# Patient Record
Sex: Male | Born: 1965 | Race: Black or African American | Hispanic: No | Marital: Married | Smoking: Never smoker
Health system: Southern US, Community
[De-identification: ages and names within clinical notes are randomized; demographics above are authoritative.]

## PROBLEM LIST (undated history)

## (undated) DIAGNOSIS — I10 Essential (primary) hypertension: Secondary | ICD-10-CM

## (undated) HISTORY — DX: Essential (primary) hypertension: I10

## (undated) HISTORY — PX: HERNIA REPAIR: SHX51

---
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
## 1986-11-27 ENCOUNTER — Emergency Department: Admit: 1986-11-27 | Payer: Self-pay | Source: Ambulatory Visit

## 1992-06-21 ENCOUNTER — Ambulatory Visit (INDEPENDENT_AMBULATORY_CARE_PROVIDER_SITE_OTHER): Admit: 1992-06-21 | Disposition: A | Discharge status: Home / Self Care | Payer: Self-pay | Source: Ambulatory Visit

## 1995-05-21 ENCOUNTER — Ambulatory Visit (INDEPENDENT_AMBULATORY_CARE_PROVIDER_SITE_OTHER): Admit: 1995-05-21 | Disposition: A | Discharge status: Home / Self Care | Payer: Self-pay | Source: Ambulatory Visit

## 1995-05-23 ENCOUNTER — Ambulatory Visit (INDEPENDENT_AMBULATORY_CARE_PROVIDER_SITE_OTHER): Admit: 1995-05-23 | Disposition: A | Discharge status: Home / Self Care | Payer: Self-pay | Source: Ambulatory Visit

## 1999-04-01 ENCOUNTER — Ambulatory Visit
Admission: RE | Admit: 1999-04-01 | Disposition: A | Discharge status: Home / Self Care | Payer: Self-pay | Source: Ambulatory Visit

## 2000-05-19 ENCOUNTER — Ambulatory Visit (INDEPENDENT_AMBULATORY_CARE_PROVIDER_SITE_OTHER): Admit: 2000-05-19 | Disposition: A | Discharge status: Home / Self Care | Payer: Self-pay | Source: Ambulatory Visit

## 2000-05-21 ENCOUNTER — Ambulatory Visit (INDEPENDENT_AMBULATORY_CARE_PROVIDER_SITE_OTHER): Admit: 2000-05-21 | Disposition: A | Discharge status: Home / Self Care | Payer: Self-pay | Source: Ambulatory Visit

## 2000-11-07 ENCOUNTER — Ambulatory Visit (INDEPENDENT_AMBULATORY_CARE_PROVIDER_SITE_OTHER): Admit: 2000-11-07 | Disposition: A | Discharge status: Home / Self Care | Payer: Self-pay | Source: Ambulatory Visit

## 2005-02-09 ENCOUNTER — Ambulatory Visit
Admission: RE | Admit: 2005-02-09 | Disposition: A | Discharge status: Home / Self Care | Payer: Self-pay | Source: Ambulatory Visit

## 2005-02-11 ENCOUNTER — Ambulatory Visit
Admission: RE | Admit: 2005-02-11 | Disposition: A | Discharge status: Home / Self Care | Payer: Self-pay | Source: Ambulatory Visit

## 2005-09-04 ENCOUNTER — Ambulatory Visit (INDEPENDENT_AMBULATORY_CARE_PROVIDER_SITE_OTHER): Admit: 2005-09-04 | Disposition: A | Discharge status: Home / Self Care | Payer: Self-pay | Source: Ambulatory Visit

## 2005-11-06 ENCOUNTER — Ambulatory Visit (INDEPENDENT_AMBULATORY_CARE_PROVIDER_SITE_OTHER): Admit: 2005-11-06 | Disposition: A | Discharge status: Home / Self Care | Payer: Self-pay | Source: Ambulatory Visit

## 2006-02-05 ENCOUNTER — Ambulatory Visit (INDEPENDENT_AMBULATORY_CARE_PROVIDER_SITE_OTHER): Admit: 2006-02-05 | Disposition: A | Discharge status: Home / Self Care | Payer: Self-pay | Source: Ambulatory Visit

## 2006-02-19 ENCOUNTER — Ambulatory Visit: Admit: 2006-02-19 | Disposition: A | Discharge status: Home / Self Care | Payer: Self-pay | Source: Ambulatory Visit

## 2006-02-23 ENCOUNTER — Ambulatory Visit
Admission: RE | Admit: 2006-02-23 | Disposition: A | Discharge status: Home / Self Care | Payer: Self-pay | Source: Ambulatory Visit

## 2006-03-08 ENCOUNTER — Ambulatory Visit
Admission: RE | Admit: 2006-03-08 | Disposition: A | Discharge status: Home / Self Care | Payer: Self-pay | Source: Ambulatory Visit

## 2007-02-20 ENCOUNTER — Ambulatory Visit (INDEPENDENT_AMBULATORY_CARE_PROVIDER_SITE_OTHER): Admit: 2007-02-20 | Disposition: A | Discharge status: Home / Self Care | Payer: Self-pay | Source: Ambulatory Visit

## 2007-07-12 ENCOUNTER — Ambulatory Visit (INDEPENDENT_AMBULATORY_CARE_PROVIDER_SITE_OTHER): Admit: 2007-07-12 | Disposition: A | Discharge status: Home / Self Care | Payer: Self-pay | Source: Ambulatory Visit

## 2007-12-14 ENCOUNTER — Ambulatory Visit: Admit: 2007-12-14 | Disposition: A | Discharge status: Home / Self Care | Payer: Self-pay | Source: Ambulatory Visit

## 2008-06-01 ENCOUNTER — Ambulatory Visit (INDEPENDENT_AMBULATORY_CARE_PROVIDER_SITE_OTHER): Admit: 2008-06-01 | Disposition: A | Discharge status: Home / Self Care | Payer: Self-pay | Source: Ambulatory Visit

## 2008-08-11 ENCOUNTER — Emergency Department
Admission: EM | Admit: 2008-08-11 | Disposition: A | Discharge status: Home / Self Care | Payer: Self-pay | Source: Ambulatory Visit

## 2008-08-13 ENCOUNTER — Emergency Department
Admission: EM | Admit: 2008-08-13 | Disposition: A | Discharge status: Home / Self Care | Payer: Self-pay | Source: Ambulatory Visit

## 2008-08-17 ENCOUNTER — Emergency Department
Admission: EM | Admit: 2008-08-17 | Disposition: A | Discharge status: Home / Self Care | Payer: Self-pay | Source: Ambulatory Visit

## 2008-09-17 ENCOUNTER — Ambulatory Visit (INDEPENDENT_AMBULATORY_CARE_PROVIDER_SITE_OTHER): Admit: 2008-09-17 | Disposition: A | Discharge status: Home / Self Care | Payer: Self-pay | Source: Ambulatory Visit

## 2008-12-22 ENCOUNTER — Ambulatory Visit (INDEPENDENT_AMBULATORY_CARE_PROVIDER_SITE_OTHER): Admit: 2008-12-22 | Disposition: A | Discharge status: Home / Self Care | Payer: Self-pay | Source: Ambulatory Visit

## 2009-03-06 ENCOUNTER — Ambulatory Visit (INDEPENDENT_AMBULATORY_CARE_PROVIDER_SITE_OTHER): Admit: 2009-03-06 | Disposition: A | Discharge status: Home / Self Care | Payer: Self-pay | Source: Ambulatory Visit

## 2009-04-10 ENCOUNTER — Ambulatory Visit (INDEPENDENT_AMBULATORY_CARE_PROVIDER_SITE_OTHER): Admit: 2009-04-10 | Disposition: A | Discharge status: Home / Self Care | Payer: Self-pay | Source: Ambulatory Visit

## 2009-04-15 ENCOUNTER — Ambulatory Visit (INDEPENDENT_AMBULATORY_CARE_PROVIDER_SITE_OTHER): Admit: 2009-04-15 | Disposition: A | Discharge status: Home / Self Care | Payer: Self-pay | Source: Ambulatory Visit

## 2009-08-04 ENCOUNTER — Ambulatory Visit (INDEPENDENT_AMBULATORY_CARE_PROVIDER_SITE_OTHER): Admit: 2009-08-04 | Disposition: A | Discharge status: Home / Self Care | Payer: Self-pay | Source: Ambulatory Visit

## 2009-08-21 ENCOUNTER — Ambulatory Visit (INDEPENDENT_AMBULATORY_CARE_PROVIDER_SITE_OTHER): Admit: 2009-08-21 | Disposition: A | Discharge status: Home / Self Care | Payer: Self-pay | Source: Ambulatory Visit

## 2010-01-10 ENCOUNTER — Ambulatory Visit
Admission: RE | Admit: 2010-01-10 | Disposition: A | Discharge status: Home / Self Care | Payer: Self-pay | Source: Ambulatory Visit

## 2010-03-09 ENCOUNTER — Ambulatory Visit (INDEPENDENT_AMBULATORY_CARE_PROVIDER_SITE_OTHER): Admit: 2010-03-09 | Disposition: A | Discharge status: Home / Self Care | Payer: Self-pay | Source: Ambulatory Visit

## 2010-03-21 ENCOUNTER — Ambulatory Visit (INDEPENDENT_AMBULATORY_CARE_PROVIDER_SITE_OTHER): Admit: 2010-03-21 | Disposition: A | Discharge status: Home / Self Care | Payer: Self-pay | Source: Ambulatory Visit

## 2010-04-10 ENCOUNTER — Ambulatory Visit (INDEPENDENT_AMBULATORY_CARE_PROVIDER_SITE_OTHER): Admit: 2010-04-10 | Disposition: A | Discharge status: Home / Self Care | Payer: Self-pay | Source: Ambulatory Visit

## 2010-05-25 ENCOUNTER — Ambulatory Visit (INDEPENDENT_AMBULATORY_CARE_PROVIDER_SITE_OTHER): Admit: 2010-05-25 | Disposition: A | Discharge status: Home / Self Care | Payer: Self-pay | Source: Ambulatory Visit

## 2010-08-12 ENCOUNTER — Ambulatory Visit (INDEPENDENT_AMBULATORY_CARE_PROVIDER_SITE_OTHER): Admit: 2010-08-12 | Disposition: A | Discharge status: Home / Self Care | Payer: Self-pay | Source: Ambulatory Visit

## 2010-12-23 ENCOUNTER — Ambulatory Visit (INDEPENDENT_AMBULATORY_CARE_PROVIDER_SITE_OTHER): Admit: 2010-12-23 | Disposition: A | Discharge status: Home / Self Care | Payer: Self-pay | Source: Ambulatory Visit

## 2011-08-15 ENCOUNTER — Ambulatory Visit (INDEPENDENT_AMBULATORY_CARE_PROVIDER_SITE_OTHER): Admit: 2011-08-15 | Disposition: A | Discharge status: Home / Self Care | Payer: Self-pay | Source: Ambulatory Visit

## 2012-06-24 ENCOUNTER — Ambulatory Visit (INDEPENDENT_AMBULATORY_CARE_PROVIDER_SITE_OTHER): Admit: 2012-06-24 | Disposition: A | Discharge status: Home / Self Care | Payer: Self-pay | Source: Ambulatory Visit

## 2012-08-12 ENCOUNTER — Ambulatory Visit (INDEPENDENT_AMBULATORY_CARE_PROVIDER_SITE_OTHER): Payer: Exclusive Provider Organization | Attending: Family Medicine

## 2012-08-12 ENCOUNTER — Encounter (INDEPENDENT_AMBULATORY_CARE_PROVIDER_SITE_OTHER): Payer: Self-pay

## 2012-08-12 ENCOUNTER — Ambulatory Visit (INDEPENDENT_AMBULATORY_CARE_PROVIDER_SITE_OTHER): Payer: Exclusive Provider Organization | Admitting: Family Medicine

## 2012-08-12 VITALS — BP 120/78 | HR 80 | Temp 98.2°F | Resp 17 | Ht 69.0 in | Wt 208.4 lb

## 2012-08-12 MED ORDER — NABUMETONE 750 MG PO TABS
750.00 mg | ORAL_TABLET | Freq: Every day | ORAL | Status: DC
Start: 2012-08-12 — End: 2015-11-20

## 2012-08-12 MED ORDER — TRAMADOL HCL 50 MG PO TABS
50.0000 mg | ORAL_TABLET | Freq: Four times a day (QID) | ORAL | Status: DC | PRN
Start: 2012-08-12 — End: 2014-01-06

## 2012-08-12 MED ORDER — HYDROCODONE-ACETAMINOPHEN 7.5-325 MG PO TABS
1.00 | ORAL_TABLET | Freq: Four times a day (QID) | ORAL | Status: DC | PRN
Start: 2012-08-12 — End: 2012-08-12

## 2012-08-12 MED ORDER — TIZANIDINE HCL 4 MG PO TABS
4.00 mg | ORAL_TABLET | Freq: Three times a day (TID) | ORAL | Status: DC | PRN
Start: 2012-08-12 — End: 2014-01-06

## 2012-08-12 NOTE — Progress Notes (Signed)
Subjective:       Patient ID: Bradley Hubbard is a 47 y.o. male.    Back Pain  This is a new problem. The current episode started 1 to 4 weeks ago. The problem occurs constantly. The problem has been gradually worsening since onset. The pain is present in the lumbar spine and gluteal. The quality of the pain is described as aching. The pain does not radiate. The pain is moderate. The pain is the same all the time. The symptoms are aggravated by bending, twisting, lying down and sitting. Stiffness is present in the morning. Pertinent negatives include no abdominal pain, bladder incontinence, bowel incontinence, chest pain, dysuria, fever, leg pain, numbness, paresis, paresthesias, pelvic pain, tingling or weakness. He has tried analgesics and NSAIDs for the symptoms. The treatment provided no relief.   off and on has back pain right lower back stiff and onset two weeks getting lots worse, can't get comfortable.  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         Review of Systems   Constitutional: Negative for fever.   Respiratory: Negative for cough and chest tightness.    Cardiovascular: Negative for chest pain.   Gastrointestinal: Negative.  Negative for abdominal pain and bowel incontinence.   Genitourinary: Negative for bladder incontinence, dysuria and pelvic pain.   Musculoskeletal: Positive for back pain.   Neurological: Negative for tingling, weakness, numbness and paresthesias.   Psychiatric/Behavioral: Negative.            Objective:    Physical Exam   Nursing note and vitals reviewed.  Constitutional: He is oriented to person, place, and time. He appears well-developed and well-nourished.   HENT:   Head: Normocephalic and atraumatic.   Eyes: Pupils are equal, round, and reactive to light.   Neck: Neck supple.   Cardiovascular: Normal rate, regular rhythm and normal heart sounds.  Exam reveals no gallop and no friction rub.    No murmur heard.  Pulmonary/Chest: Effort normal and  breath sounds normal. No respiratory distress. He has no wheezes. He has no rales. He exhibits no tenderness.   Abdominal: Soft. Bowel sounds are normal. He exhibits no distension and no mass. There is no tenderness. There is no rebound and no guarding.   Musculoskeletal:        Tenderness over lower lumbar spine especially right lower side.  SLR neg to 90 degrees, normal heel/toe walk. DTR's 2+ and =     Neurological: He is alert and oriented to person, place, and time.   Skin: Skin is warm.   Psychiatric: He has a normal mood and affect. His behavior is normal. Judgment and thought content normal.           Assessment:       Back strain      Plan:       Lab Results from today's visit:  No results found for this or any previous visit (from the past 4 hour(s)).    Radiology Results from today's visit:  No results found.    Assessment/Plan for today's visit:  1. Back pain  X-ray lumbar spine complete    tiZANidine (ZANAFLEX) 4 MG tablet    nabumetone (RELAFEN) 750 MG tablet    DISCONTINUED: HYDROcodone-acetaminophen (NORCO) 7.5-325 MG per tablet   2. Back strain, initial encounter  traMADol (ULTRAM) 50 MG tablet       Chaya Jan, MD  [facility]  08/12/2012 12:10  PM        Patient Instructions     Back Spasm [No Trauma]    Spasm of the back muscles can occur after a sudden forceful twisting or bending force (such as in a car accident), after a simple awkward movement, or after lifting something heavy with poor body positioning. In either case, muscle spasm is often present and adds to the pain. Sleeping in an awkward position or on a poor quality mattress can also cause this. Some persons respond to emotional stress by tensing the muscles of their back.  The treatment described below will usually help the pain to go away in 5-7 days. Pain that continues may require further evaluation or other types of treatment such as physical therapy.  Unless you had a physical injury (for example, a car accident  or fall), x-rays are usually not ordered for the initial evaluation of back pain. If pain continues and does not respond to medical treatment, x-rays and other tests may be performed at a later time.   Home Care:  1. You may need to stay in bed the first few days. But, as soon as possible, begin sitting or walking to avoid problems with prolonged bed rest (muscle weakness, worsening back stiffness and pain, blood clots in the legs).   2. When in bed, try to find a position of comfort. A firm mattress is best. Try lying flat on your back with pillows under your knees. You can also try lying on your side with your knees bent up toward your chest and a pillow between your knees.  3. Avoid prolonged sitting. This puts more stress on the lower back than standing or walking.   4. Some persons find relief with heat (hot shower, hot bath, or heating pad) and massage, while others prefer cold packs (crushed or cubed ice in a plastic bag, wrapped in a towel). Try both and use the method that feels best for 20 minutes several times a day.  5. You may use acetaminophen (Tylenol) or ibuprofen (Motrin, Advil) to control pain, unless another pain medicine was prescribed. [NOTE: If you have chronic liver or kidney disease or ever had a stomach ulcer or GI bleeding, talk with your doctor before using these medicines.]  6. Gentle stretching will help your back heal faster. Perform this simple routine 2-3 times a day until your back is feeling better.    LOW BACK STRETCH    Lie on your back with your knees bent and both feet on the ground.    Slowly raise your left knee to your chest as you flatten your lower back against the floor. Hold for 5 seconds.    Relax and repeat the exercise with your right knee.    Do 10 of these exercises for each leg.    Repeat, hugging both knees to your chest at the same time.   7. Be aware of safe lifting methods and do not lift anything over 15 pounds until all the pain is gone.  Follow Up  with  your doctor or this facility if your symptoms do not start to improve after one week. Physical therapy or further tests may be needed.  [NOTE: If x-rays were taken, they will be reviewed by a radiologist. You will be notified of any new findings that may affect your care.]  Return Promptly or contact your doctor if any of the following occurs:   Pain becomes worse or spreads to your legs   Weakness  or numbness in one or both legs   Loss of bowel or bladder control   Numbness in the groin or genital area   Unexplained fever over 100.74F (38.0C)   Burning or pain when passing urine   [address] [place], [place] [address]. All rights reserved. This information is not intended as a substitute for professional medical care. Always follow your healthcare professional's instructions.

## 2012-08-12 NOTE — Patient Instructions (Signed)
Back Spasm [No Trauma]    Spasm of the back muscles can occur after a sudden forceful twisting or bending force (such as in a car accident), after a simple awkward movement, or after lifting something heavy with poor body positioning. In either case, muscle spasm is often present and adds to the pain.Sleeping in an awkward position or on a poor quality mattress can also cause this. Some persons respond to emotional stress by tensing the muscles of their back.  The treatment described below will usually help the pain to go away in 5-7 days. Pain that continues may require further evaluation or other types of treatment such as physical therapy.  Unless you had a physical injury (for example, a car accident or fall), x-rays are usually not ordered for the initial evaluation of back pain. If pain continues and does not respond to medical treatment, x-rays and other tests may be performed at a later time.  Home Care:  1. You may need to stay in bed the first few days. But, as soon as possible, begin sitting or walking to avoid problems with prolonged bed rest (muscle weakness, worsening back stiffness and pain, blood clots in the legs).  2. When in bed, try to find a position of comfort. A firm mattress is best. Try lying flat on your back with pillows under your knees. You can also try lying on your side with your knees bent up toward your chest and a pillow between your knees.  3. Avoid prolonged sitting. This puts more stress on the lower back than standing or walking.  4. Some persons find relief with heat (hot shower, hot bath, or heating pad) and massage, while others prefer cold packs (crushed or cubed ice in a plastic bag, wrapped in a towel). Try both and use the method that feels best for 20 minutes several times a day.  5. You may use acetaminophen (Tylenol) or ibuprofen (Motrin, Advil) to control pain, unless another pain medicine was prescribed. [NOTE: If you have chronic liver or kidney disease or  ever had a stomach ulcer or GI bleeding, talk with your doctor before using these medicines.]  6. Gentle stretching will help your back heal faster. Perform this simple routine 2-3 times a day until your back is feeling better.   LOW BACK STRETCH   Lie on your back with your knees bent and both feet on the ground.   Slowly raise your left knee to your chest as you flatten your lower back against the floor. Hold for 5 seconds.   Relax and repeat the exercise with your right knee.   Do 10 of these exercises for each leg.   Repeat, hugging both knees to your chest at the same time.  7. Be aware of safe lifting methods and do not lift anything over 15 pounds until all the pain is gone.  Follow Up  with your doctor or this facility if your symptoms do not start to improve after one week. Physical therapy or further tests may be needed.  [NOTE: If x-rays were taken, they will be reviewed by a radiologist. You will be notified of any new findings that may affect your care.]  Return Promptly or contact your doctor if any of the following occurs:   Pain becomes worse or spreads to your legs   Weakness or numbness in one or both legs   Loss of bowel or bladder control   Numbness in the groin or genital area     Unexplained fever over 100.4F (38.0C)   Burning or pain when passing urine   2000-2014 [employer], [address], [place] [address]. All rights reserved. This information is not intended as a substitute for professional medical care. Always follow your healthcare professional's instructions.

## 2012-08-15 ENCOUNTER — Telehealth (INDEPENDENT_AMBULATORY_CARE_PROVIDER_SITE_OTHER): Payer: Self-pay

## 2012-08-15 NOTE — Telephone Encounter (Signed)
Courtesy call- Message left for patient.

## 2012-09-28 ENCOUNTER — Ambulatory Visit (INDEPENDENT_AMBULATORY_CARE_PROVIDER_SITE_OTHER): Payer: Exclusive Provider Organization | Admitting: Physician Assistant

## 2012-09-28 ENCOUNTER — Encounter (INDEPENDENT_AMBULATORY_CARE_PROVIDER_SITE_OTHER): Payer: Self-pay

## 2012-09-28 VITALS — BP 125/88 | HR 73 | Temp 98.7°F | Resp 18 | Wt 205.0 lb

## 2012-09-28 LAB — VH POCT UA-AUTOMATED(UCC)
Bilirubin, UA POCT: NEGATIVE
Blood, UA POCT: NEGATIVE
Glucose, UA POCT: NEGATIVE
Ketones, UA POCT: NEGATIVE mg/dL
Nitrite, UA POCT: NEGATIVE
PH, UA POCT: 6.5 (ref 4.6–8)
Protein, UA POCT: NEGATIVE mg/dL
Specific Gravity, UA POCT: 1.025 mg/dL (ref 1.001–1.035)
Urine Leukocytes POCT: NEGATIVE
Urobilinogen, UA POCT: 0.2 mg/dL

## 2012-09-28 MED ORDER — TAMSULOSIN HCL 0.4 MG PO CAPS
0.40 mg | ORAL_CAPSULE | ORAL | Status: AC
Start: 2012-09-28 — End: 2012-10-05

## 2012-09-28 NOTE — Progress Notes (Signed)
Subjective:    Patient ID: Bradley Hubbard is a 47 y.o. male.    HPI Comments: Patient said he denies any injury to cause his right mid back pain.  He said he did a normal work out routine while at work yesterday and didn't have any symptoms. Then around dinner time while cooking on the grill he had sudden sharp pain in his right mid back.  He denies any personal or family history of kidney stones.  He took some old pain medication and said he was up all night and could not get comfortable and cant sit still today.  No fevers or chills.  He denies drinking enough fluids while at work in the heat.  He took some old zanaflex last night but did not help his symptoms.  He denies any difficulty or pain with urination.    Flank Pain  This is a new problem. The current episode started yesterday. The problem occurs constantly. The pain is present in the thoracic spine. The quality of the pain is described as aching and stabbing. The pain does not radiate. The pain is at a severity of 7/10. Pertinent negatives include no abdominal pain or dysuria.       The following portions of the patient's history were reviewed and updated as appropriate: allergies, current medications, past family history, past medical history, past social history, past surgical history and problem list.    Review of Systems   Constitutional: Negative.    HENT: Negative.    Respiratory: Negative.    Cardiovascular: Negative.    Gastrointestinal: Negative.  Negative for abdominal pain.   Genitourinary: Positive for flank pain. Negative for dysuria.   Musculoskeletal: Positive for back pain.         Objective:    BP 125/88  Pulse 73  Temp 98.7 F (37.1 C) (Oral)  Resp 18  Wt 92.987 kg (205 lb)    Physical Exam   Nursing note and vitals reviewed.  Constitutional: He is oriented to person, place, and time. He appears well-developed and well-nourished.   HENT:   Mouth/Throat: Oropharynx is clear and moist.   Neck: Neck supple.   Cardiovascular:  Normal rate, regular rhythm and normal heart sounds.    Pulmonary/Chest: Effort normal and breath sounds normal.   Abdominal: Soft. Bowel sounds are normal. There is no CVA tenderness.   Musculoskeletal: He exhibits tenderness. He exhibits no edema.        Thoracic back: He exhibits tenderness and pain. He exhibits normal range of motion, no bony tenderness, no swelling, no edema, no deformity, no laceration, no spasm and normal pulse.        Back:    Neurological: He is alert and oriented to person, place, and time.   Psychiatric: He has a normal mood and affect. His behavior is normal.         Assessment and Plan:       Lab Results from today's visit:  Recent Results (from the past 4 hour(s))   VH POCT UA-AUTOMATED(UCC)    Collection Time    09/28/12  5:47 PM       Component Value Range    Color UA POCT Dark Yellow      Clarity UA POCT Clear      Glucose, UA POCT Negative  Negative    Bilirubin, UA POCT Negative  Negative    Ketones, UA POCT Negative  Negative mg/dL    Specific Gravity, UA POCT 1.025  1.001 -  1.035 mg/dL    Blood, UA POCT  Negative  Negative    PH, UA POCT 6.5  4.6 - 8    Protein, UA POCT Negative  Negative mg/dL    Urobilinogen, UA POCT 0.2  0.2, 1.0 mg/dL    Nitrite, UA POCT Negative  Negative    Leukocytes, UA POCT Negative  Negative       Radiology Results from today's visit:  No results found.    Assessment/Plan for today's visit:  1. Back pain  VH POCT UA AUTO    tamsulosin (FLOMAX) 0.4 MG Cap       Nathen May Morven, [place]  [facility]  09/28/2012 6:20 PM        Patient Instructions       Understanding Kidney Stones  Your kidneys are the chemical filters for your body. These bean-shaped organs constantly screen your blood, removing wastes and excess fluids. Healthy kidneys maintain the chemical balance your body needs.    What Are Kidney Stones?  Kidney stones are made up of chemical crystals that separate out from urine. These crystals clump together to make"stones."They form in the  calyx of the kidney. They may stay in the kidney or move into the urinary tract.  Why Kidney Stones Form  Kidneys form stones for many reasons. If you don't drink enough fluid, for instance, you won't have enough urine to dilute chemicals. Then the chemicals may form crystals, which can develop into stones.   Fluid loss (dehydration) can concentrate urine, causing stones to form.   Certain foods contain large amounts of the chemicals that sometimes crystallize into stones.   Kidney infections foster stones by slowing urine flow or changing the acid balance of your urine.   Family history: If relatives have had kidney stones, you're more likely to have them, too.  Where Stones Form  Stones begin in the cup-shaped part of the kidney (calyx). Some stay in the calyx and grow. Others moveinto the kidney pelvis or into the ureter. There they can lodge, block the flow of urine, and cause pain.  Symptoms  Many stones cause sudden and severe pain andbloody urine. Others cause nausea or frequent, burning urination. Symptoms often depend on your stone's size and location. Fever may indicate a serious infection. Call your doctor right away if you develop a fever.   [address] [place], [place] [address]. All rights reserved. This information is not intended as a substitute for professional medical care. Always follow your healthcare professional's instructions.      Back Pain [Acute Or Chronic]    Back pain is usually caused by an injury to the muscles or ligaments of the spine. Sometimes the disks that separate each bone in the spine may bulge and cause pain by pressing on a nearby nerve. Back pain may also appear after a sudden twisting/bending force (such as in a car accident), after a simple awkward movement, or lifting something heavy with poor body positioning. In either case, muscle spasm is often present and adds to the pain.  Acute back pain usually gets better in one to two weeks.  Back pain related to disk disease, arthritis in the spinal joints or spinal stenosis (narrowing of the spinal canal) can become chronic and last for months or years.  Unless you had a physical injury (for example, a car accident or fall) X-rays are usually not ordered for the initial evaluation of back pain. If pain continues and  does not respond to medical treatment, x-rays and other tests may be performed at a later time.  Home Care:  1. You may need to stay in bed the first few days. But, as soon as possible, begin sitting or walking to avoid problems with prolonged bed rest (muscle weakness, worsening back stiffness and pain, blood clots in the legs).  2. When in bed, try to find a position of comfort. A firm mattress is best. Try lying flat on your back with pillows under your knees. You can also try lying on your side with your knees bent up towards your chest and a pillow between your knees.  3. Avoid prolonged sitting. This puts more stress on the lower back than standing or walking.  4. During the first two days after injury, apply an ICE PACK to the painful area for 20 minutes every 2-4 hours. This will reduce swelling and pain. HEAT (hot shower, hot bath or heating pad) works well for muscle spasm. You can start with ice, then switch to heat after two days. Some patients feel best alternating ice and heat treatments. Use the one method that feels the best to you.  5. You may use acetaminophen (Tylenol) or ibuprofen (Motrin, Advil) to control pain, unless another pain medicine was prescribed. [NOTE: If you have chronic liver or kidney disease or ever had a stomach ulcer or GI bleeding, talk with your doctor before using these medicines.]  6. Be aware of safe lifting methods and do not lift anything over 15 pounds until all the pain is gone.  Follow Up  with your doctor or this facility if your symptoms do not start to improve after one week. Physical therapy may be needed.  [NOTE: If X-rays were taken,  they will be reviewed by a radiologist. You will be notified of any new findings that may affect your care.]  Get Prompt Medical Attention  if any of the following occur:   Pain becomes worse or spreads to your legs   Weakness or numbness in one or both legs   Loss of bowel or bladder control   Numbness in the groin or genital area   [address] [place], [place] [address]. All rights reserved. This information is not intended as a substitute for professional medical care. Always follow your healthcare professional's instructions.        Patient aware to call PCP in the morning for further evaluation of right mid back pain. He can try flomax for possible kidney stone causing pain.  Away flomax has similar chemical compounds that make up sulfa drug but not a sulfa drug.  Since broke out in rash from sulfa small change for compound reaction but to be aware of risk and rash could be worse second time around.  Recommended tylenol/ibuprofen for pain.  Gave list for urology in [place], worsening symptoms during the night or tomorrow proceed to ER.  Patient agrees with plan.  Encouraged increased hydration with water.            Leanord Asal, [place]  [facility]  09/28/2012  5:33 PM

## 2012-09-28 NOTE — Patient Instructions (Addendum)
Understanding Kidney Stones  Your kidneys are the chemical filters for your body. These bean-shaped organs constantly screen your blood, removing wastes and excess fluids. Healthy kidneys maintain the chemical balance your body needs.    What Are Kidney Stones?  Kidney stones are made up of chemical crystals that separate out from urine. These crystals clump together to make"stones."They form in the calyx of the kidney. They may stay in the kidney or move into the urinary tract.  Why Kidney Stones Form  Kidneys form stones for many reasons. If you don't drink enough fluid, for instance, you won't have enough urine to dilute chemicals. Then the chemicals may form crystals, which can develop into stones.   Fluid loss (dehydration) can concentrate urine, causing stones to form.   Certain foods contain large amounts of the chemicals that sometimes crystallize into stones.   Kidney infections foster stones by slowing urine flow or changing the acid balance of your urine.   Family history: If relatives have had kidney stones, you're more likely to have them, too.  Where Stones Form  Stones begin in the cup-shaped part of the kidney (calyx). Some stay in the calyx and grow. Others moveinto the kidney pelvis or into the ureter. There they can lodge, block the flow of urine, and cause pain.  Symptoms  Many stones cause sudden and severe pain andbloody urine. Others cause nausea or frequent, burning urination. Symptoms often depend on your stone's size and location. Fever may indicate a serious infection. Call your doctor right away if you develop a fever.   [address] [place], [place] [address]. All rights reserved. This information is not intended as a substitute for professional medical care. Always follow your healthcare professional's instructions.      Back Pain [Acute Or Chronic]    Back pain is usually caused by an injury to the muscles or ligaments of the spine. Sometimes  the disks that separate each bone in the spine may bulge and cause pain by pressing on a nearby nerve. Back pain may also appear after a sudden twisting/bending force (such as in a car accident), after a simple awkward movement, or lifting something heavy with poor body positioning. In either case, muscle spasm is often present and adds to the pain.  Acute back pain usually gets better in one to two weeks. Back pain related to disk disease, arthritis in the spinal joints or spinal stenosis (narrowing of the spinal canal) can become chronic and last for months or years.  Unless you had a physical injury (for example, a car accident or fall) X-rays are usually not ordered for the initial evaluation of back pain. If pain continues and does not respond to medical treatment, x-rays and other tests may be performed at a later time.  Home Care:  1. You may need to stay in bed the first few days. But, as soon as possible, begin sitting or walking to avoid problems with prolonged bed rest (muscle weakness, worsening back stiffness and pain, blood clots in the legs).  2. When in bed, try to find a position of comfort. A firm mattress is best. Try lying flat on your back with pillows under your knees. You can also try lying on your side with your knees bent up towards your chest and a pillow between your knees.  3. Avoid prolonged sitting. This puts more stress on the lower back than standing or walking.  4. During the first two days after  injury, apply an ICE PACK to the painful area for 20 minutes every 2-4 hours. This will reduce swelling and pain. HEAT (hot shower, hot bath or heating pad) works well for muscle spasm. You can start with ice, then switch to heat after two days. Some patients feel best alternating ice and heat treatments. Use the one method that feels the best to you.  5. You may use acetaminophen (Tylenol) or ibuprofen (Motrin, Advil) to control pain, unless another pain medicine was prescribed. [NOTE: If  you have chronic liver or kidney disease or ever had a stomach ulcer or GI bleeding, talk with your doctor before using these medicines.]  6. Be aware of safe lifting methods and do not lift anything over 15 pounds until all the pain is gone.  Follow Up  with your doctor or this facility if your symptoms do not start to improve after one week. Physical therapy may be needed.  [NOTE: If X-rays were taken, they will be reviewed by a radiologist. You will be notified of any new findings that may affect your care.]  Get Prompt Medical Attention  if any of the following occur:   Pain becomes worse or spreads to your legs   Weakness or numbness in one or both legs   Loss of bowel or bladder control   Numbness in the groin or genital area   [address], [place], [place] [address]. All rights reserved. This information is not intended as a substitute for professional medical care. Always follow your healthcare professional's instructions.

## 2012-09-30 ENCOUNTER — Ambulatory Visit (INDEPENDENT_AMBULATORY_CARE_PROVIDER_SITE_OTHER)
Admission: RE | Admit: 2012-09-30 | Discharge: 2012-09-30 | Disposition: A | Discharge status: Home / Self Care | Payer: Exclusive Provider Organization | Source: Ambulatory Visit | Attending: Physician Assistant | Admitting: Physician Assistant

## 2012-09-30 ENCOUNTER — Other Ambulatory Visit (INDEPENDENT_AMBULATORY_CARE_PROVIDER_SITE_OTHER): Payer: Self-pay | Admitting: Urology

## 2012-09-30 DIAGNOSIS — N23 Unspecified renal colic: Secondary | ICD-10-CM

## 2013-01-17 ENCOUNTER — Encounter (INDEPENDENT_AMBULATORY_CARE_PROVIDER_SITE_OTHER): Payer: Self-pay

## 2013-01-17 ENCOUNTER — Ambulatory Visit (INDEPENDENT_AMBULATORY_CARE_PROVIDER_SITE_OTHER): Payer: Commercial Managed Care - POS | Admitting: Physician Assistant

## 2013-01-17 VITALS — BP 135/85 | HR 80 | Temp 98.1°F | Resp 19

## 2013-01-17 MED ORDER — AMOXICILLIN-POT CLAVULANATE 875-125 MG PO TABS
1.0000 | ORAL_TABLET | Freq: Two times a day (BID) | ORAL | Status: AC
Start: 2013-01-17 — End: 2013-01-27

## 2013-01-17 MED ORDER — FLUTICASONE PROPIONATE 50 MCG/ACT NA SUSP
NASAL | Status: AC
Start: 2013-01-17 — End: 2013-02-17

## 2013-01-17 NOTE — Patient Instructions (Addendum)
Viral Respiratory Illness [Adult]  You have an Upper Respiratory Illness (URI) caused by a virus. This illness is contagious during the first few days. It is spread through the air by coughing and sneezing or by direct contact (touching the sick person and then touching your own eyes, nose or mouth). Most viral illnesses go away within 7-10 days with rest and simple home remedies. Sometimes, the illness may last for several weeks. Antibiotics will not kill a virus and are generally not prescribed for this condition.    Home Care:  1) If symptoms are severe, rest at home for the first 2-3 days. When you resume activity, don't let yourself get too tired.  2) Avoid being exposed to cigarette smoke (yours or others').  3) Tylenol (acetaminophen) or ibuprofen (Advil, Motrin) will help fever, muscle aching and headache. (Persons under 18 with fever should not take aspirin since this may cause liver damage.)  4) Your appetite may be poor, so a light diet is fine. Avoid dehydration by drinking 6-8 glasses of fluids per day (water, soft, drinks, juices, tea, soup, etc.). Extra fluids will help loosen secretions in the nose and lungs.  5) Over-the-counter cold medicines will not shorten the length of time you're sick, but they may be helpful for the following symptoms: cough (Robitussin DM); sore throat (Chloraseptic lozenges or spray); nasal and sinus congestion (Actifed, Sudafed, Chlortrimeton).  Follow Up  with your doctor or as advised if you don't improve over the next week.  Get Prompt Medical Attention  if any of the following occur:  -- Cough with lots of colored sputum (mucus) or blood in your sputum  -- Chest pain, shortness of breath, wheezing or have trouble breathing  -- Severe headache; face, neck or ear pain  -- Fever over 100.4 F (38.0 C) for more than three days  -- You can't swallow due to throat pain   2000-2014 [employer], [address], [place] [address]. All rights reserved. This  information is not intended as a substitute for professional medical care. Always follow your healthcare professional's instructions.      Pharyngitis: Strep [Presumed]    Your illness has the signs of a strep throat infection. Strep throat is a contagious illness. It is spread by coughing, kissing or by touching others after touching your mouth or nose. Symptoms include throat pain worse with swallowing, aching all over, headache and fever. You will be treated with an antibiotic, which should make you start to feel better within 1-2 days.  Home Care:  1. Rest at home and drink plenty of fluids to avoid dehydration.  2. No school or work for the first two days on antibiotics. You will not be contagious after this time, and if you are feeling better, you can return to school or work.  3. Take your antibiotics for a full 10 days, even if you feel better after the first few days of treatment. This is very important to prevent complications from the strep infection (such as heart or kidney disease).  4. Children: Use acetaminophen (Tylenol) for fever, fussiness or discomfort. In infants over six months of age, you may use ibuprofen (Children's Motrin) instead of Tylenol. [NOTE: If your child has chronic liver or kidney disease or ever had a stomach ulcer or GI bleeding, talk with your doctor before using these medicines.] (Aspirin should never be used in anyone under 18 years of age who is ill with a fever. It may cause severe liver damage.)    Adults: You may use acetaminophen (Tylenol) or ibuprofen (Motrin, Advil) to control pain or fever, unless another medicine was prescribed for this. [NOTE: If you have chronic liver or kidney disease or ever had a stomach ulcer or GI bleeding, talk with your doctor before using these medicines.]  5. Throat lozenges or sprays (Chloraseptic and others) will reduce pain. Gargling with warm salt water will also reduce throat pain. Dissolve 1/2 teaspoon of salt in 1 glass of warm water.  This is especially useful just before meals.  Follow Up  with your doctor or as directed by our staff if you are not improving over the next week.  Get Prompt Medical Attention  if any of the following occur:   Fever over 100.5F (38.0C) oral, or over 101.5F (38.6C) rectal for more than three days   New or worsening ear pain, sinus pain or headache   Painful lumps in the back of your neck   Unable to swallow liquids or open your mouth wide due to throat pain   Trouble breathing or noisy breathing   Muffled voice   New rash   2000-2014 [employer], [address], [place] [address]. All rights reserved. This information is not intended as a substitute for professional medical care. Always follow your healthcare professional's instructions.

## 2013-01-17 NOTE — Progress Notes (Signed)
Subjective:    Patient ID: Bradley Hubbard is a 47 y.o. male.    HPI Comments: Patient tells me he has been coughing the past week with green mucous production worse in the morning and night, cloudy sputum during the day.  Daughter is currently being treated for strep throat.  No fevers or chills, no flu like symptoms.  He does admit to sinus pressure and sore throat.  No vomiting or diarrhea.    Cough  This is a new problem. The current episode started in the past 7 days. The problem has been gradually worsening. The cough is productive of sputum. Associated symptoms include nasal congestion, postnasal drip and a sore throat. Pertinent negatives include no ear congestion, ear pain, fever, headaches, hemoptysis, myalgias, shortness of breath or wheezing.       The following portions of the patient's history were reviewed and updated as appropriate: allergies, current medications, past family history, past medical history, past social history, past surgical history and problem list.    Review of Systems   Constitutional: Negative.  Negative for fever.   HENT: Positive for congestion, postnasal drip, sinus pressure and sore throat. Negative for ear pain.    Respiratory: Positive for cough. Negative for apnea, hemoptysis, choking, chest tightness, shortness of breath, wheezing and stridor.    Cardiovascular: Negative.    Gastrointestinal: Negative.    Musculoskeletal: Negative.  Negative for myalgias.   Neurological: Negative for headaches.         Objective:    BP 135/85  Pulse 80  Temp 98.1 F (36.7 C) (Oral)  Resp 19  SpO2 98%    Physical Exam   Nursing note and vitals reviewed.  Constitutional: He is oriented to person, place, and time. He appears well-developed and well-nourished.   HENT:   Right Ear: Tympanic membrane, external ear and ear canal normal.   Left Ear: Tympanic membrane, external ear and ear canal normal.   Nose: Mucosal edema present.   Mouth/Throat: Uvula is midline and mucous membranes  are normal. Posterior oropharyngeal erythema present. No oropharyngeal exudate, posterior oropharyngeal edema or tonsillar abscesses.   Neck: Neck supple.   Cardiovascular: Normal rate, regular rhythm and normal heart sounds.    Pulmonary/Chest: Effort normal and breath sounds normal.   Lymphadenopathy:     He has no cervical adenopathy.   Neurological: He is alert and oriented to person, place, and time.   Psychiatric: He has a normal mood and affect. His behavior is normal.         Assessment and Plan:       Lab Results from today's visit:  No results found for this or any previous visit (from the past 4 hour(s)).    Radiology Results from today's visit:  No results found.    Assessment/Plan for today's visit:  1. Pharyngitis  amoxicillin-clavulanate (AUGMENTIN) 875-125 MG per tablet   2. Nasal congestion  fluticasone (FLONASE) 50 MCG/ACT nasal spray       Leanord Asal, [place]  [facility]  01/17/2013 8:36 PM        Patient Instructions       Viral Respiratory Illness [Adult]  You have an Upper Respiratory Illness (URI) caused by a virus. This illness is contagious during the first few days. It is spread through the air by coughing and sneezing or by direct contact (touching the sick person and then touching your own eyes, nose or mouth). Most viral illnesses go away within 7-10  days with rest and simple home remedies. Sometimes, the illness may last for several weeks. Antibiotics will not kill a virus and are generally not prescribed for this condition.    Home Care:  1) If symptoms are severe, rest at home for the first 2-3 days. When you resume activity, don't let yourself get too tired.  2) Avoid being exposed to cigarette smoke (yours or others').  3) Tylenol (acetaminophen) or ibuprofen (Advil, Motrin) will help fever, muscle aching and headache. (Persons under 18 with fever should not take aspirin since this may cause liver damage.)  4) Your appetite may be poor, so a light diet is fine. Avoid  dehydration by drinking 6-8 glasses of fluids per day (water, soft, drinks, juices, tea, soup, etc.). Extra fluids will help loosen secretions in the nose and lungs.  5) Over-the-counter cold medicines will not shorten the length of time you're sick, but they may be helpful for the following symptoms: cough (Robitussin DM); sore throat (Chloraseptic lozenges or spray); nasal and sinus congestion (Actifed, Sudafed, Chlortrimeton).  Follow Up  with your doctor or as advised if you don't improve over the next week.  Get Prompt Medical Attention  if any of the following occur:  -- Cough with lots of colored sputum (mucus) or blood in your sputum  -- Chest pain, shortness of breath, wheezing or have trouble breathing  -- Severe headache; face, neck or ear pain  -- Fever over 100.4 F (38.0 C) for more than three days  -- You can't swallow due to throat pain   [address], [address] [place], [place] [address]. All rights reserved. This information is not intended as a substitute for professional medical care. Always follow your healthcare professional's instructions.      Pharyngitis: Strep [Presumed]    Your illness has the signs of a strep throat infection. Strep throat is a contagious illness. It is spread by coughing, kissing or by touching others after touching your mouth or nose. Symptoms include throat pain worse with swallowing, aching all over, headache and fever. You will be treated with an antibiotic, which should make you start to feel better within 1-2 days.  Home Care:  1. Rest at home and drink plenty of fluids to avoid dehydration.  2. No school or work for the first two days on antibiotics. You will not be contagious after this time, and if you are feeling better, you can return to school or work.  3. Take your antibiotics for a full 10 days, even if you feel better after the first few days of treatment. This is very important to prevent complications from the strep infection (such as  heart or kidney disease).  4. Children: Use acetaminophen (Tylenol) for fever, fussiness or discomfort. In infants over six months of age, you may use ibuprofen (Children's Motrin) instead of Tylenol. [NOTE: If your child has chronic liver or kidney disease or ever had a stomach ulcer or GI bleeding, talk with your doctor before using these medicines.] (Aspirin should never be used in anyone under 31 years of age who is ill with a fever. It may cause severe liver damage.)  Adults: You may use acetaminophen (Tylenol) or ibuprofen (Motrin, Advil) to control pain or fever, unless another medicine was prescribed for this. [NOTE: If you have chronic liver or kidney disease or ever had a stomach ulcer or GI bleeding, talk with your doctor before using these medicines.]  5. Throat lozenges or sprays (Chloraseptic and others)  will reduce pain. Gargling with warm salt water will also reduce throat pain. Dissolve 1/2 teaspoon of salt in 1 glass of warm water. This is especially useful just before meals.  Follow Up  with your doctor or as directed by our staff if you are not improving over the next week.  Get Prompt Medical Attention  if any of the following occur:   Fever over 100.60F (38.0C) oral, or over 101.60F (38.6C) rectal for more than three days   New or worsening ear pain, sinus pain or headache   Painful lumps in the back of your neck   Unable to swallow liquids or open your mouth wide due to throat pain   Trouble breathing or noisy breathing   Muffled voice   New rash   [address] [place], [place] [address]. All rights reserved. This information is not intended as a substitute for professional medical care. Always follow your healthcare professional's instructions.          Flonase nasal spray twice daily, will treat clinic strep throat given exposure to daughter and symptoms.  History of chronic sinusitis.  Encouraged probiotic use in between doses to help prevent diarrhea  related side effects.  Worrisome symptoms of concern please proceed to ER.              Leanord Asal, [place]  [facility]  01/17/2013  8:36 PM

## 2013-11-29 ENCOUNTER — Encounter (INDEPENDENT_AMBULATORY_CARE_PROVIDER_SITE_OTHER): Payer: Self-pay

## 2013-11-29 ENCOUNTER — Ambulatory Visit (INDEPENDENT_AMBULATORY_CARE_PROVIDER_SITE_OTHER): Payer: Commercial Managed Care - POS | Admitting: Family Medicine

## 2013-11-29 VITALS — BP 134/86 | HR 94 | Temp 101.3°F | Resp 16 | Ht 69.0 in | Wt 211.6 lb

## 2013-11-29 DIAGNOSIS — J029 Acute pharyngitis, unspecified: Secondary | ICD-10-CM

## 2013-11-29 DIAGNOSIS — J020 Streptococcal pharyngitis: Secondary | ICD-10-CM

## 2013-11-29 LAB — POCT RAPID STREP A: Rapid Strep A Screen POCT: POSITIVE — AB

## 2013-11-29 MED ORDER — AMOXICILLIN 875 MG PO TABS
875.0000 mg | ORAL_TABLET | Freq: Two times a day (BID) | ORAL | Status: AC
Start: 2013-11-29 — End: 2013-12-09

## 2013-11-29 NOTE — Progress Notes (Signed)
Subjective:    Patient ID: Bradley Hubbard is a 48 y.o. male.    Sore Throat   This is a new problem. The current episode started in the past 7 days. The problem has been gradually worsening. The maximum temperature recorded prior to his arrival was 101 - 101.9 F. He has had exposure to strep. He has tried nothing for the symptoms. The treatment provided no relief.       The following portions of the patient's history were reviewed and updated as appropriate: allergies, current medications, past family history, past medical history, past social history, past surgical history and problem list.    Review of Systems   Constitutional: Positive for fever and chills.   HENT: Positive for sinus pressure and sore throat.    All other systems reviewed and are negative.        Objective:    BP 134/86 mmHg  Pulse 94  Temp(Src) 101.3 F (38.5 C) (Oral)  Resp 16  Ht 1.753 m (5\' 9" )  Wt 95.981 kg (211 lb 9.6 oz)  BMI 31.23 kg/m2    Physical Exam   Constitutional: He is oriented to person, place, and time. He appears well-developed and well-nourished. No distress.   HENT:   Head: Normocephalic and atraumatic.   Right Ear: Tympanic membrane, external ear and ear canal normal.   Left Ear: Tympanic membrane, external ear and ear canal normal.   Nose: Nose normal.   Mouth/Throat: Oropharynx is clear and moist. No oropharyngeal exudate (red).   Eyes: Conjunctivae are normal.   Neck: Normal range of motion. Neck supple.   Cardiovascular: Normal rate, regular rhythm and normal heart sounds.    Pulmonary/Chest: Effort normal and breath sounds normal. No stridor. No respiratory distress. He has no wheezes. He has no rales.   Musculoskeletal: Normal range of motion.   Neurological: He is alert and oriented to person, place, and time.   Skin: Skin is warm and dry. He is not diaphoretic.   Psychiatric: He has a normal mood and affect. His behavior is normal. Judgment and thought content normal.   Nursing note and vitals  reviewed.          Lab Results last 48 Hours     Procedure Component Value Units Date/Time    POCT RAPID STREP A [[id number]]  (Abnormal) Collected:  11/29/13 1523    Specimen Information:  Throat Updated:  11/29/13 1523     POCT QC Pass      Rapid Strep A Screen POCT Positive (A)      Comment        Result:        Negative Results should be confirmed by throat Cx to confirm absence of Strep A inf.            Assessment and Plan:       1. Streptococcal sore throat    - amoxicillin (AMOXIL) 875 MG tablet; Take 1 tablet (875 mg total) by mouth 2 (two) times daily.  Dispense: 20 tablet; Refill: 0    2. Pharyngitis    - POCT RAPID STREP A  - amoxicillin (AMOXIL) 875 MG tablet; Take 1 tablet (875 mg total) by mouth 2 (two) times daily.  Dispense: 20 tablet; Refill: 0      Follow up with your Primary Care Physician or Return to Clinic if symptoms persist or worsen. Patient/Family verbalizes understanding.  Discussed with patient in length and detail signs and symptoms to seek immediate follow  up. Patient expressed understanding.   An After Visit Summary was printed and given to the patient.

## 2013-12-14 ENCOUNTER — Other Ambulatory Visit (INDEPENDENT_AMBULATORY_CARE_PROVIDER_SITE_OTHER): Payer: Self-pay | Admitting: Internal Medicine

## 2013-12-14 DIAGNOSIS — R911 Solitary pulmonary nodule: Secondary | ICD-10-CM

## 2013-12-25 ENCOUNTER — Ambulatory Visit (INDEPENDENT_AMBULATORY_CARE_PROVIDER_SITE_OTHER)
Admission: RE | Admit: 2013-12-25 | Discharge: 2013-12-25 | Disposition: A | Discharge status: Home / Self Care | Payer: Commercial Managed Care - POS | Source: Ambulatory Visit | Attending: Internal Medicine | Admitting: Internal Medicine

## 2013-12-25 ENCOUNTER — Encounter (INDEPENDENT_AMBULATORY_CARE_PROVIDER_SITE_OTHER): Payer: Self-pay

## 2013-12-25 DIAGNOSIS — R911 Solitary pulmonary nodule: Secondary | ICD-10-CM

## 2014-01-06 ENCOUNTER — Ambulatory Visit (INDEPENDENT_AMBULATORY_CARE_PROVIDER_SITE_OTHER): Payer: Commercial Managed Care - POS | Admitting: Family Medicine

## 2014-01-06 ENCOUNTER — Encounter (INDEPENDENT_AMBULATORY_CARE_PROVIDER_SITE_OTHER): Payer: Self-pay

## 2014-01-06 VITALS — BP 141/79 | HR 90 | Temp 98.3°F | Resp 16 | Ht 69.0 in | Wt 211.0 lb

## 2014-01-06 DIAGNOSIS — M62830 Muscle spasm of back: Secondary | ICD-10-CM

## 2014-01-06 MED ORDER — BACLOFEN 10 MG PO TABS
10.00 mg | ORAL_TABLET | Freq: Three times a day (TID) | ORAL | Status: DC
Start: 2014-01-06 — End: 2015-11-20

## 2014-01-06 NOTE — Patient Instructions (Signed)
NSAIDs/ice/heat/ROM/massage

## 2014-01-06 NOTE — Progress Notes (Signed)
Subjective:    Patient ID: Bradley Hubbard is a 48 y.o. male.    Back Pain  This is a new problem. The current episode started today. The problem is unchanged. The pain is present in the lumbar spine. The quality of the pain is described as aching and cramping. The pain does not radiate. The pain is at a severity of 8/10. The symptoms are aggravated by bending and coughing.       The following portions of the patient's history were reviewed and updated as appropriate: allergies, current medications, past family history, past medical history, past social history, past surgical history and problem list.    Review of Systems   Musculoskeletal: Positive for back pain.         Objective:    BP 141/79 mmHg  Pulse 90  Temp(Src) 98.3 F (36.8 C) (Oral)  Resp 16  Ht 1.753 m (5\' 9" )  Wt 95.709 kg (211 lb)  BMI 31.15 kg/m2    Physical Exam   Constitutional: He appears well-developed and well-nourished.   Cardiovascular: Normal rate.    Pulmonary/Chest: Breath sounds normal.   Musculoskeletal: He exhibits tenderness. He exhibits no edema.   Pain, spasm, stiffness lumbar low back R>L. Neg SLRs. No B&B c/o         Assessment and Plan:       Bradley Hubbard was seen today for back pain.    Diagnoses and all orders for this visit:    Spasm of back muscles  Orders:  -     baclofen (LIORESAL) 10 MG tablet; Take 1 tablet (10 mg total) by mouth 3 (three) times daily.            Orlie Pollen, MD  [facility]  01/06/2014  4:36 PM

## 2014-05-02 ENCOUNTER — Ambulatory Visit (INDEPENDENT_AMBULATORY_CARE_PROVIDER_SITE_OTHER): Payer: Commercial Managed Care - POS | Admitting: Nurse Practitioner

## 2014-05-02 ENCOUNTER — Ambulatory Visit (INDEPENDENT_AMBULATORY_CARE_PROVIDER_SITE_OTHER): Payer: Commercial Managed Care - POS | Attending: Nurse Practitioner

## 2014-05-02 ENCOUNTER — Encounter (INDEPENDENT_AMBULATORY_CARE_PROVIDER_SITE_OTHER): Payer: Self-pay

## 2014-05-02 VITALS — BP 143/82 | HR 70 | Temp 98.8°F | Resp 16 | Ht 69.0 in | Wt 200.0 lb

## 2014-05-02 DIAGNOSIS — Y998 Other external cause status: Secondary | ICD-10-CM

## 2014-05-02 DIAGNOSIS — Y92099 Unspecified place in other non-institutional residence as the place of occurrence of the external cause: Secondary | ICD-10-CM

## 2014-05-02 DIAGNOSIS — W230XXA Caught, crushed, jammed, or pinched between moving objects, initial encounter: Secondary | ICD-10-CM

## 2014-05-02 DIAGNOSIS — Y939 Activity, unspecified: Secondary | ICD-10-CM

## 2014-05-02 DIAGNOSIS — S6992XA Unspecified injury of left wrist, hand and finger(s), initial encounter: Secondary | ICD-10-CM

## 2014-05-02 NOTE — Progress Notes (Signed)
Fitted Lt. Middle finger with Stax splint and secured with Coban.

## 2014-05-02 NOTE — Progress Notes (Signed)
Subjective:    Patient ID: Bradley Hubbard is a 49 y.o. male.    HPI Bradley Hubbard presents to [facility] today due to left middle finger crush injury. He reports his entire finger hurts and radiates into his left hand. He rates his current pain level as 8/10.     The following portions of the patient's history were reviewed and updated as appropriate: allergies, current medications, past family history, past medical history, past social history, past surgical history and problem list.    Review of Systems   Review of Systems   Constitutional: Negative for fever, chills, appetite change and fatigue.   HENT: Negative   Eyes: Negative for discharge, altered vision  Respiratory: Negative for chest tightness and shortness of breath  Cardiovascular: Negative for chest pain   Gastrointestinal: Negative for nausea, vomiting, abdominal pain and diarrhea    Genitourinary: Negative for frequency, burning, difficulty urinating  Musculoskeletal: Positive for left middle finger crush injury  Skin: Negative for rash or open wounds    Neurological: Negative for headaches or dizziness  Psychiatric/Behavioral: Negative for behavioral changes        Objective:    BP 143/82 mmHg  Pulse 70  Temp(Src) 98.8 F (37.1 C) (Oral)  Resp 16  Ht 1.753 m (5\' 9" )  Wt 90.719 kg (200 lb)  BMI 29.52 kg/m2    Physical Exam   Constitutional: oriented to person, place, and time. Appears well-developed and well-nourished.   HENT:   Head: Normocephalic and atraumatic.   Right Ear: External ear normal.  Left Ear: External ear normal.   Nose: Nose normal without drainage.  Eyes: Normal conjunctiva without drainage  Pulmonary/Chest: Effort normal.   Musculoskeletal: Normal range of motion with pain distal left middle finger. There is tenderness to palpation of the entire left middle finger.    Neurological: The patient is alert and oriented to person, place, and time.   Skin: Skin is warm and dry. There is slight bleeding along the side of the nail of  the left middle finger.  Psychiatric:  Normal mood and affect.         Assessment and Plan:     X-ray Finger Left Pa, Oblique And Lateral    05/02/2014   3 views of the left third phalanx demonstrate no acute fracture nor destructive osseous lesion.  ReadingStation:[id number]  Bradley Hubbard was seen today for finger injury.    Diagnoses and all orders for this visit:    Injury of left middle finger, initial encounter  Orders:  -     X-ray Finger Left PA, Oblique and Lateral  -     Referral to Orthopaedic Sports Med  -     Application finger splint static; Standing  -     Application finger splint static    The patient was instructed to return to [facility] if his nailbed turns purple and a hole may be placed. He was instructed to follow up with ortho with continued pain. He verbalized understanding and agreed to the plan.      Sharene Butters, NP  [facility]  05/02/2014  2:33 PM

## 2014-05-02 NOTE — Patient Instructions (Signed)
Crush Injury of the Hand, No Fracture  You have a crush injury of your hand. This causes local pain, swelling, and sometimes bruising. You don't have any broken bones. This injury may take from a few days to a few weeks to heal.  If a fingernail has been severely injured, it may fall off in 1 to 2 weeks. A new one will usually start to grow back within a month.  Home care  Follow these guidelines when caring for yourself at home:   Keep your hand elevated to reduce pain and swelling. When sitting or lying down keep your arm raised above the level of your heart. You can do this by placing your arm on a pillow that rests on your chest or on a pillow at your side. This is most important during the first 2 days (48 hours) after the injury.   Put an ice pack on the injured area. Do this for 20 minutes every 1 to 2 hours the first day for pain relief. You can make an ice pack by wrapping a plastic bag of ice cubes in a thin towel. Continue to use the ice pack 3 to 4 times a day until the pain and swelling go away.   You may use acetaminophen or ibuprofen to control pain, unless another pain medicine was prescribed. If you have chronic liver or kidney disease, talk with your health care provider before using these medicines. Also talk with your provider if you've had a stomach ulcer or GI bleeding.   If you have a splint or cast, keep it dry at all times. Bathe with your splint or cast well out of the water. Protect it with a large plastic bag, rubber-banded at the top end. If a fiberglass cast or splint gets wet, you can dry it with a hair dryer.   Don't put creams or objects under the cast if you have itching.   Don't stick a needle into the wound to drain it.   Bruised skin may change colors over time. It may change from reddish to bluish to yellowish before the skin goes back to normal coloring.  Follow-up care  Follow up with your health care provider, or as advised, if you don't start to get better within  the next 3 days.  If X-rays were taken, a radiologist will look at them. You will be told of any new findings that may affect your care.  When to seek medical advice  Call your health care provider right away if any of these occur:   The cast cracks   The plaster cast or splint becomes wet or soft   The fiberglass cast or splint stays wet for more than 24 hours   Tightness or pain under the cast or splint gets worse   Fingers become swollen, cold, blue, numb, or tingly   Redness, warmth, swelling, drainage from the wound, or foul odor from a cast or splint   You can't move your fingers   Fever of 101F (38.3C) or higher, or as directed by your health care provider   2000-2015 [employer] [address], [place] [address]. All rights reserved. This information is not intended as a substitute for professional medical care. Always follow your healthcare professional's instructions.

## 2014-05-18 ENCOUNTER — Ambulatory Visit (INDEPENDENT_AMBULATORY_CARE_PROVIDER_SITE_OTHER): Payer: Commercial Managed Care - POS | Admitting: Physician Assistant

## 2014-05-18 ENCOUNTER — Encounter (INDEPENDENT_AMBULATORY_CARE_PROVIDER_SITE_OTHER): Payer: Self-pay

## 2014-05-18 VITALS — BP 139/72 | HR 68 | Temp 98.8°F | Resp 15 | Ht 69.0 in | Wt 200.0 lb

## 2014-05-18 DIAGNOSIS — J0100 Acute maxillary sinusitis, unspecified: Secondary | ICD-10-CM

## 2014-05-18 MED ORDER — FLUTICASONE PROPIONATE 50 MCG/ACT NA SUSP
NASAL | Status: AC
Start: 2014-05-18 — End: 2014-06-18

## 2014-05-18 MED ORDER — AMOXICILLIN-POT CLAVULANATE 875-125 MG PO TABS
1.0000 | ORAL_TABLET | Freq: Two times a day (BID) | ORAL | Status: DC
Start: 2014-05-18 — End: 2015-11-20

## 2014-05-18 NOTE — Progress Notes (Signed)
Subjective:    Patient ID: Bradley Hubbard is a 49 y.o. male.    Sinus Problem  This is a new problem. The current episode started in the past 7 days. The problem has been gradually worsening since onset. The maximum temperature recorded prior to his arrival was 100.4 - 100.9 F. The fever has been present for less than 1 day. Associated symptoms include congestion, coughing, ear pain and sinus pressure. Past treatments include nothing.       The following portions of the patient's history were reviewed and updated as appropriate: allergies, current medications, past family history, past medical history, past social history, past surgical history and problem list.    Review of Systems   Constitutional: Negative for fever.   HENT: Positive for congestion, ear pain and sinus pressure.    Respiratory: Positive for cough.    Gastrointestinal: Negative for vomiting and diarrhea.   Skin: Negative for rash and wound.   Hematological: Negative for adenopathy.         Objective:    BP 139/72 mmHg  Pulse 68  Temp(Src) 98.8 F (37.1 C) (Oral)  Resp 15  Ht 1.753 m (5\' 9" )  Wt 90.719 kg (200 lb)  BMI 29.52 kg/m2    Physical Exam   Constitutional: He appears well-developed and well-nourished. No distress.   HENT:   Head: Normocephalic.   Right Ear: External ear normal.   Left Ear: External ear normal.   Mouth/Throat: Oropharynx is clear and moist.   Eyes: Pupils are equal, round, and reactive to light.   Neck: Normal range of motion. Neck supple.   Cardiovascular: Normal rate, regular rhythm and normal heart sounds.    No murmur heard.  Pulmonary/Chest: Effort normal and breath sounds normal. No stridor. No respiratory distress. He has no wheezes. He has no rales.   Musculoskeletal: Normal range of motion.   Neurological: He is alert.   Skin: Skin is warm and dry. No rash noted. He is not diaphoretic. No erythema. No pallor.   Psychiatric: He has a normal mood and affect.   Nursing note and vitals reviewed.         Assessment and Plan:       Bradley Hubbard was seen today for sinus problem.    Diagnoses and all orders for this visit:    Acute maxillary sinusitis, recurrence not specified  Orders:  -     amoxicillin-clavulanate (AUGMENTIN) 875-125 MG per tablet; Take 1 tablet by mouth 2 (two) times daily. Take with food.  Take with probiotics.  -     fluticasone (FLONASE) 50 MCG/ACT nasal spray; Use 2 Sprays into each nostril daily            Joyce Gross, [place]  [facility]  05/18/2014  11:18 AM

## 2014-05-18 NOTE — Patient Instructions (Signed)
Sinusitis (Antibiotic Treatment)    The sinuses are air-filled spaces within the bones of the face. They connect to the inside of the nose.Sinusitisis an inflammation of the tissue lining the sinus cavity. Sinus inflammation can occur during a cold. It can also be due to allergies to pollens and other particles in the air. Sinusitis can cause symptoms of sinus congestion and fullness. A sinus infection causes fever, headache and facial pain. There is often green or yellow drainage from the nose or into the back of the throat (post-nasal drip). You have been given antibiotics to treat this condition.  Home care:   Take the full course of antibiotics as instructed. Do not stop taking them, even if you feel better.   Drink plenty of water, hot tea, and other liquids. This may help thin mucus. It also may promote sinus drainage.   Heat may help soothe painful areas of the face. Use a towel soaked in hot water. Or, stand in the shower and direct the hot spray onto your face. Using a vaporizer along with a menthol rub at night may also help.   Anexpectorantcontaining guaifenesin may help thin the mucus and promote drainage from the sinuses.   Over-the-counterdecongestantsmay be used unless a similar medicine was prescribed. Nasal sprays work the fastest. Use one that contains phenylephrine or oxymetazoline. First blow the nose gently. Then use the spray. Do not use these medicines more often than directed on the label or symptoms may get worse. You may also use tablets containing pseudoephedrine. Avoid products that combine ingredients, because side effects may be increased. Read labels. You can also ask the pharmacist for help. (NOTE:Persons with high blood pressure should not use decongestants. They can raise blood pressure.)   Over-the-counterantihistaminesmay help if allergies contributed to your sinusitis.    Do not use nasal rinses or irrigation during an acute sinus infection, unless told to by  your health care provider. Rinsing may spread the infection to other sinuses.   Use acetaminophen or ibuprofen to control pain, unless another pain medicine was prescribed. (If you have chronic liver or kidney disease or ever had a stomach ulcer, talk with your doctor before using these medicines. Aspirin should never be used in anyone under 18 years of age who is ill with a fever. It may cause severe liver damage.)   Don't smoke. This can worsen symptoms.  Follow-up care  Follow up with your healthcare provider or our staff if you are not improving within the next week.  When to seek medical advice  Call your healthcare provider if any of these occur:   Facial pain or headache becoming more severe   Stiff neck   Unusual drowsiness or confusion   Swelling of the forehead or eyelids   Vision problems, including blurred or double vision   Fever of100.4F (38C)or higher, or as directed by your healthcare provider   Seizure   Breathing problems   Symptoms not resolving within 10 days   2000-2015 [employer] [address], [place] [address]. All rights reserved. This information is not intended as a substitute for professional medical care. Always follow your healthcare professional's instructions.

## 2014-05-21 ENCOUNTER — Telehealth (INDEPENDENT_AMBULATORY_CARE_PROVIDER_SITE_OTHER): Payer: Self-pay

## 2014-05-21 NOTE — Telephone Encounter (Signed)
Message left for patient to callback if he has any questions or concerns re: recent visit.

## 2015-07-29 ENCOUNTER — Inpatient Hospital Stay: Admission: RE | Admit: 2015-07-29 | Payer: Commercial Managed Care - POS | Source: Ambulatory Visit

## 2015-07-29 DIAGNOSIS — G4733 Obstructive sleep apnea (adult) (pediatric): Secondary | ICD-10-CM

## 2015-07-31 NOTE — Procedures (Signed)
Date: 07/29/2015  CHIEF COMPLAINT: "I need my CPAP compliance reviewed."    HISTORY OF PRESENT ILLNESS: Mr. Scearce is a very pleasant  gentleman.  He is followed by Dr. Darrall Dears.  Because of the  complaints of daytime fatigue and snoring, the patient was  referred to the sleep lab.  The patient's diagnostic study  demonstrated an RDI of 31.  The patient was brought back in and  had a CPAP titration performed.  On 10 cm of CPAP, the patient  had near total resolution of events.    He presents today in followup.  The patient wears a CPAP every  night.  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  They have a very  unusual work schedule 24 hours on, 24 hours off for four days  and then they are off for four days.  Given this, the patient  sleep compliance on the nights he is working is approximately  70%.  On nights where he is not working, it is 100%.  I have  seen the documentation for this.  In addition to all of this,  the patient indicates that he feels significantly improved.  Prior to the patient's sleep study, he was only admitting that  he was minimally fatigued.  He woke up every day with a mild  headache, which he also dismissed.  Subsequent to wearing CPAP,  the patient has found a significant amount of energy that he  thought he just did not have.    He also notes that the headache has resolved.    No snoring with CPAP in place.  The patient denies nocturia.    OTHER MEDICAL PROBLEMS: Include hypertension.    No family members with sleep apnea.    Denies sleep walking or sleep talking.  Denies dreams or  nightmares.    GENERAL REVIEW OF SYSTEMS: At this time, denies fevers or  chills.  Denies sweats.  Denies photophobia or diplopia.  Denies  neck stiffness or swelling.    Denies chest pain.    PHYSICAL EXAMINATION:  GENERAL:  Delightful gentleman, in no acute distress.  VITAL SIGNS:  Reviewed.  HEENT:  Normocephalic, atraumatic head.  Face symmetric.  Pupils  are equal and round.  Eyelids and conjunctivae are  unremarkable.  Oropharynx, redundant tissue, soft palate, and uvula.  Mallampati score is 3.  Neck circumference is normal.  Trachea  midline and mobile.  LUNGS:  Clear.  CARDIAC:  Regular rhythm.  EXTREMITIES:  No edema or cyanosis.  MENTAL STATUS:  Oriented x3 with normal memory and mood.  MUSCULOSKELETAL:  Gait and station are normal.    ASSESSMENT: The patient with documented obstructive sleep apnea.  The patient is wearing his CPAP appropriately and he is  benefitting from it.  The patient is therefore compliant with  his CPAP use by standard criteria.    At this time, the importance of weight loss and weight control  were discussed.    We will have the patient follow up with me on an as-needed basis  going forward.        [id number]  DD: 07/29/2015 15:07:30  DT: 07/29/2015 15:38:57  JOB: [id number]

## 2015-11-20 ENCOUNTER — Ambulatory Visit (INDEPENDENT_AMBULATORY_CARE_PROVIDER_SITE_OTHER): Payer: Commercial Managed Care - POS | Admitting: Family Medicine

## 2015-11-20 ENCOUNTER — Encounter (INDEPENDENT_AMBULATORY_CARE_PROVIDER_SITE_OTHER): Payer: Self-pay

## 2015-11-20 VITALS — BP 111/64 | HR 66 | Temp 99.0°F | Resp 16 | Ht 69.0 in | Wt 200.0 lb

## 2015-11-20 DIAGNOSIS — J0140 Acute pansinusitis, unspecified: Secondary | ICD-10-CM

## 2015-11-20 DIAGNOSIS — J029 Acute pharyngitis, unspecified: Secondary | ICD-10-CM

## 2015-11-20 LAB — POCT RAPID STREP A: Rapid Strep A Screen POCT: NEGATIVE

## 2015-11-20 MED ORDER — AMOXICILLIN-POT CLAVULANATE 875-125 MG PO TABS
1.0000 | ORAL_TABLET | Freq: Two times a day (BID) | ORAL | 0 refills | Status: AC
Start: 2015-11-20 — End: 2015-11-27

## 2015-11-20 NOTE — Patient Instructions (Addendum)
Sinusitis (No Antibiotics)    The sinuses are air-filled spaces within the bones of the face. They connect to the inside of the nose.Sinusitisis an inflammation of the tissue lining the sinus cavity. Sinus inflammation can occur during a cold.It can also be due to allergies to pollens and other particles in the air.It can cause symptoms such as sinus congestion,headache, sore throat, facial swellingand fullness. It may also cause a low-grade fever.No infection is present, and no antibiotic treatment is needed.  Home care   Drink plenty of water, hot tea, and other liquids. This may help thin mucus. It also may promote sinus drainage.   Heat may help soothe painful areas of the face. Use a towel soaked in hot water. Or, stand in the shower and direct the hot spray onto your face. Using a vaporizer along with a menthol rub at night may also help.   Anexpectorantcontaining guaifenesin may help thin the mucus and promote drainage from the sinuses.   Over-the-counterdecongestantsmay be used unless a similar medicine was prescribed. Nasal sprays work the fastest. Use one that contains phenylephrine or oxymetazoline. First blow the nose gently. Then use the spray. Do not use these medicines more often than directed on the label or symptoms may get worse. You may also use tablets containing pseudoephedrine. Avoid products that combine ingredients, because side effects may be increased. Read labels. You can also ask the pharmacist for help. (NOTE:Persons with high blood pressure should not use decongestants. They can raise blood pressure.)   Over-the-counterantihistaminesmay help if allergies contributed to your sinusitis.    Use acetaminophen or ibuprofen to control pain, unless another pain medicine was prescribed. (If you have chronic liver or kidney disease or ever had a stomach ulcer, talk with your doctor before using these medicines. Aspirin should never be used in anyone under 18 years of age  who is ill with a fever. It may cause severe liver damage.)   Use nasal rinses or irrigation as instructed by your health care provider.   Don't smoke. This can worsen symptoms.  Follow-up care  Follow up with your healthcare provider or our staff if you are not improving within the next week.  When to seek medical advice  Call your healthcare provider if any of these occur:   Green or yellow discharge from the nose or into the throat   Facial pain or headache becoming more severe   Stiff neck   Unusual drowsiness or confusion   Swelling of the forehead or eyelids   Vision problems, including blurred or double vision   Fever of100.4F (38C)or higher, or as directed by your healthcare provider   Seizure   Breathing problems   Symptoms not resolving within 10 days  Date Last Reviewed: 05/08/2013   2000-2016 [employer] [address], [place] [address]. All rights reserved. This information is not intended as a substitute for professional medical care. Always follow your healthcare professional's instructions.

## 2015-11-20 NOTE — Progress Notes (Signed)
Subjective:    Patient ID: Bradley Hubbard is a 50 y.o. male.    Sore Throat    This is a new problem. The current episode started in the past 7 days. The problem has been unchanged. Neither side of throat is experiencing more pain than the other. There has been no fever. The pain is moderate. Associated symptoms include congestion, coughing, ear pain and headaches. Pertinent negatives include no shortness of breath, trouble swallowing or vomiting. He has tried nothing for the symptoms.       The following portions of the patient's history were reviewed and updated as appropriate: allergies, current medications, past medical history, past social history, past surgical history and problem list.    Review of Systems   HENT: Positive for congestion and ear pain. Negative for trouble swallowing.    Respiratory: Positive for cough. Negative for shortness of breath.    Gastrointestinal: Negative for vomiting.   Neurological: Positive for headaches.   All other systems reviewed and are negative.        Objective:    BP 111/64   Pulse 66   Temp 99 F (37.2 C) (Oral)   Resp 16   Ht 1.753 m (5\' 9" )   Wt 90.7 kg (200 lb)   BMI 29.53 kg/m     Physical Exam   Constitutional: He is oriented to person, place, and time. He appears well-developed and well-nourished. No distress.   HENT:   Head: Normocephalic and atraumatic.   Right Ear: Tympanic membrane, external ear and ear canal normal.   Left Ear: Tympanic membrane, external ear and ear canal normal.   Nose: Mucosal edema present.   Mouth/Throat: Uvula is midline and mucous membranes are normal. Posterior oropharyngeal erythema present.   Eyes: Conjunctivae and EOM are normal.   Neck: Normal range of motion. Neck supple.   Cardiovascular: Normal rate and regular rhythm.    Pulmonary/Chest: Effort normal and breath sounds normal.   Musculoskeletal: Normal range of motion.   Lymphadenopathy:     He has no cervical adenopathy.   Neurological: He is alert and oriented  to person, place, and time.   Skin: Skin is warm and dry. He is not diaphoretic.   Psychiatric: He has a normal mood and affect.   Nursing note and vitals reviewed.    Lab Results from today's visit:  Recent Results (from the past 4 hour(s))   POCT RAPID STREP A    Collection Time: 11/20/15 11:18 AM   Result Value Ref Range    POCT QC Pass     Rapid Strep A Screen POCT Negative  Negative    Comment       Negative Results should be confirmed by throat Cx to confirm absence of Strep A inf.       Radiology Results from today's visit:  No results found.        Assessment and Plan:       Bradley Hubbard was seen today for sore throat.    Diagnoses and all orders for this visit:    Pharyngitis, unspecified etiology  -     POCT RAPID STREP A negative     Acute non-recurrent pansinusitis  -     amoxicillin-clavulanate (AUGMENTIN) 875-125 MG per tablet; Take 1 tablet by mouth 2 (two) times daily.for 7 days  Wait and see abx given   Advised rest and fluids; discussed appropriate otc sx tx for use prn.   Follow up with PCP or RTC  if there are any new or worsening symptoms or if the symptoms are lasting longer than expected.  Patient/guardian expressed understanding and agreement with plan of care at time of discharge.               Isaiah Blakes, MD  [facility]  11/20/2015  11:42 AM

## 2015-11-23 ENCOUNTER — Telehealth (INDEPENDENT_AMBULATORY_CARE_PROVIDER_SITE_OTHER): Payer: Self-pay

## 2015-11-23 NOTE — Telephone Encounter (Signed)
Called to check on patient after recent visit. Left a message to call back if any questions or concerns.

## 2016-06-09 ENCOUNTER — Ambulatory Visit (INDEPENDENT_AMBULATORY_CARE_PROVIDER_SITE_OTHER): Payer: Commercial Managed Care - POS | Admitting: Physician Assistant

## 2016-06-09 ENCOUNTER — Encounter (INDEPENDENT_AMBULATORY_CARE_PROVIDER_SITE_OTHER): Payer: Self-pay

## 2016-06-09 VITALS — BP 135/83 | HR 89 | Temp 99.1°F | Resp 18 | Ht 69.0 in | Wt 200.0 lb

## 2016-06-09 DIAGNOSIS — R059 Cough, unspecified: Secondary | ICD-10-CM

## 2016-06-09 DIAGNOSIS — R05 Cough: Secondary | ICD-10-CM

## 2016-06-09 DIAGNOSIS — H6692 Otitis media, unspecified, left ear: Secondary | ICD-10-CM

## 2016-06-09 MED ORDER — AMOXICILLIN 875 MG PO TABS
875.0000 mg | ORAL_TABLET | Freq: Two times a day (BID) | ORAL | 0 refills | Status: AC
Start: 2016-06-09 — End: 2016-06-19

## 2016-06-09 MED ORDER — BENZONATATE 100 MG PO CAPS
100.0000 mg | ORAL_CAPSULE | Freq: Three times a day (TID) | ORAL | 1 refills | Status: DC | PRN
Start: 2016-06-09 — End: 2016-08-08

## 2016-06-09 NOTE — Progress Notes (Signed)
Subjective:    Patient ID: Bradley Hubbard is a 51 y.o. male.    Has had worse allergy symptoms "for awhile" then 4 days developed sore throat, sinus pressure and cough. Ear pain over the last 2 days.  Cough is the worst symptoms. Keeps him awake at night, productive in a.m.  Has hx of ear infections and thinks they are becoming infected rather than just "full."  No frank fever but feels quite unwell. Has been taking antihistamine and using netti pot.       Sinus Problem   This is a new problem. The current episode started in the past 7 days. The problem has been gradually worsening since onset. There has been no fever. The fever has been present for 5 days or more. His pain is at a severity of 4/10. Associated symptoms include chills, congestion, coughing, ear pain, sinus pressure and a sore throat. Pertinent negatives include no diaphoresis, headaches, shortness of breath or sneezing. Past treatments include oral decongestants. The treatment provided mild relief.       The following portions of the patient's history were reviewed and updated as appropriate: allergies, current medications, past family history, past medical history, past social history, past surgical history and problem list.    Review of Systems   Constitutional: Positive for activity change, chills and fatigue. Negative for diaphoresis and fever.   HENT: Positive for congestion, ear pain, sinus pressure, sore throat and tinnitus. Negative for hearing loss and sneezing.    Respiratory: Positive for cough. Negative for shortness of breath.    Gastrointestinal: Negative for abdominal pain, diarrhea and nausea.   Neurological: Negative for light-headedness, numbness and headaches.         Objective:    BP 135/83   Pulse 89   Temp 99.1 F (37.3 C) (Oral)   Resp 18   Ht 1.753 m (5\' 9" )   Wt 90.7 kg (200 lb)   BMI 29.53 kg/m     Physical Exam   Constitutional: He appears well-developed and well-nourished.   HENT:   Head: Normocephalic.    Right Ear: External ear and ear canal normal.   Left Ear: External ear and ear canal normal. Tympanic membrane is injected and erythematous. A middle ear effusion is present.   Nose: Mucosal edema present. Epistaxis is observed. Right sinus exhibits maxillary sinus tenderness. Left sinus exhibits maxillary sinus tenderness.   Mouth/Throat: Oropharynx is clear and moist.   Eyes: Conjunctivae and EOM are normal. Pupils are equal, round, and reactive to light.   Neck: Normal range of motion.   Cardiovascular: Normal rate and regular rhythm.    Pulmonary/Chest: Effort normal and breath sounds normal.   Lymphadenopathy:     He has no cervical adenopathy.         Assessment and Plan:       Tymier was seen today for sinus problem.    Diagnoses and all orders for this visit:    Acute otitis media, left    Cough    Other orders  -     amoxicillin (AMOXIL) 875 MG tablet; Take 1 tablet (875 mg total) by mouth 2 (two) times daily.for 10 days  -     benzonatate (TESSALON PERLES) 100 MG capsule; Take 1 capsule (100 mg total) by mouth 3 (three) times daily as needed for Cough.    Discussed with pt that AOM can be viral but given pain, TM bulging  Reasonable to treat.  Given other URI symptoms  continue nasal saline irrigation, consider nasal steroid spray, mucinex and hot steamy showers. Avoid sudafed secondary to HTN.  If cough worsens, gets SOB, ear pain fails to improve or other concerning SS RTC for further evaluation. PT verbalizes understanding and agrees with plan.         Marylou Flesher, PA  [facility]  06/09/2016  5:39 PM

## 2016-06-09 NOTE — Patient Instructions (Signed)
Otitis Media (Middle-Ear Infection) in Adults  Otitis media is another name for a middle-ear infection. It means an infection behind your eardrum. This kind of ear infection can happen after any condition that keeps fluid from draining from the middle ear. These conditions include allergies, a cold, a sore throat, or a respiratory infection.  Middle-ear infections are common in children, but they can also happen in adults. An ear infection in an adult may mean a more serious problem than in a child. So you may need additional tests. If you have an ear infection, you should see your health care provider for treatment.  What are the types of middle-ear infections?  Infections can affect the middle ear in several ways. They are:   Acute otitis media. This middle-ear infection occurs suddenly. It causes swelling and redness. Fluid and mucus become trapped inside the ear. You can have a fever and ear pain.   Otitis media with effusion. Fluid (effusion) and mucus build up in the middle ear after the infection goes away. You may feel like your middle ear is full. This can continue for months and may affect yourhearing.   Chronic otitis media with effusion. Fluid(effusion) remains in the middle ear for a long time. Or it builds up again and again, even though there is no infection. This type of middle-ear infection may be hard to treat. It may also affect your hearing.  Who is more likely to get a middle-ear infection?  You are more likely to get an ear infection if you:   Smoke or are around someone who smokes   Have seasonal or year-round allergy symptoms   Have a cold or other upper respiratory infection  What causes a middle-ear infection?  The middle ear connects to the throat by a canal called the eustachian tube. This tube helps even out the pressure between the outer ear and the inner ear. A cold or allergy can irritate the tube or cause the area around it to swell. This can keep fluid from draining from  the middle ear. The fluid builds up behind the eardrum. Bacteria and viruses can grow in this fluid. The bacteria and viruses cause the middle-ear infection.  What are the symptoms of a middle-ear infection?  Common symptoms of a middle-ear infection in adults are:   Pain in 1 or both ears   Drainage from the ear   Muffled hearing   Sore throat  You may also have a fever. Rarely, your balance can be affected.  These symptoms may be the same as for other conditions. It's important to talk with your health care provider if you think you have a middle-ear infection. If you have a high fever, severe pain behind your ear, or paralysis in your face, see your provider as soon as you can.  How is a middle-ear infection diagnosed?  Your health care provider will take a medical history and do a physical exam. He or she will look at the outer ear and eardrum with an otoscope. The otoscope is a lighted tool that lets your provider see inside the ear. A pneumatic otoscope blows a puff of air into the ear to check how well your eardrum moves. If you eardrum doesn't move well, it may mean you have fluid behind it.  Your provider may also do a test called tympanometry. This test tells how well the middle ear is working. It can find any changes in pressure in the middle ear. Your provider may test   your hearing with a tuning fork.  How is a middle-ear infection treated?  A middle-ear infection may be treated with:   Antibiotics, taken by mouth or as ear drops   Medication for pain   Decongestants, antihistamines, or nasal steroids  Your health care provider may also have you try autoinsufflation. This helps adjust the air pressure in your ear. For this, you pinch your nose and gently exhale. This forces air back through the eustachian tube.  The exact treatment for your ear infection will depend on the type of infection you have. In general, if your symptoms don't get better in 48 to 72 hours, contact your health care  provider.  Middle-ear infections can cause long-term problems if not treated. They can lead to:   Infection in other parts of the head   Permanent hearing loss   Paralysis of a nerve in your face  If you have a middle-ear infection that doesn't get better, you may need to see an ear, nose, and throat specialist (otolaryngologist). You may need a CT scan or MRI to check for head and neck cancer.  Ear tubes  Sometimes fluid stays in the middle ear even after you take antibiotics and the infection goes away. In this case, your health care provider may suggest that a small tube be placed in your ear. The tube is put at the opening of the eardrum. The tube keeps fluid from building up and relieves pressure in the middle ear. It can also help you hear better. This surgery is called myringotomy. It is not often done in adults.  The tubes usually fall out on their own after 6 months to a year.   2000-2015 [employer] [address], [place] [address]. All rights reserved. This information is not intended as a substitute for professional medical care. Always follow your healthcare professional's instructions.

## 2016-06-12 ENCOUNTER — Telehealth (INDEPENDENT_AMBULATORY_CARE_PROVIDER_SITE_OTHER): Payer: Self-pay

## 2016-06-12 NOTE — Telephone Encounter (Signed)
Courtesy call, left message for patient.

## 2016-08-08 ENCOUNTER — Encounter (INDEPENDENT_AMBULATORY_CARE_PROVIDER_SITE_OTHER): Payer: Self-pay

## 2016-08-08 ENCOUNTER — Ambulatory Visit (INDEPENDENT_AMBULATORY_CARE_PROVIDER_SITE_OTHER): Payer: Commercial Managed Care - POS | Admitting: Nurse Practitioner

## 2016-08-08 VITALS — BP 128/89 | HR 68 | Temp 98.3°F | Resp 18 | Ht 69.0 in | Wt 215.0 lb

## 2016-08-08 DIAGNOSIS — M545 Low back pain, unspecified: Secondary | ICD-10-CM

## 2016-08-08 MED ORDER — CYCLOBENZAPRINE HCL 5 MG PO TABS
ORAL_TABLET | ORAL | 0 refills | Status: DC
Start: 2016-08-08 — End: 2019-11-28

## 2016-08-08 NOTE — Progress Notes (Signed)
Subjective:    Patient ID: Bradley Hubbard is a 51 y.o. male.    Tightness/ spasm after sitting for a period of time & then getting up to walk.  After a few steps & more activity, seems to loosen.  Denies injury  Denies numbness, tingling, weakness in extremities.  Works as a Theatre stage manager, has a long commute to work, back stiff & more tight when getting out of car after commute      Back Pain   This is a recurrent problem. The current episode started in the past 7 days. The problem occurs intermittently. The problem is unchanged. The pain is present in the lumbar spine. The pain does not radiate. The pain is at a severity of 5/10. The pain is worse during the night. The symptoms are aggravated by sitting. Pertinent negatives include no abdominal pain, bladder incontinence, bowel incontinence, dysuria, fever, leg pain, numbness, paresthesias, tingling or weakness. He has tried home exercises and NSAIDs for the symptoms.       The following portions of the patient's history were reviewed and updated as appropriate: allergies, current medications, past family history, past medical history, past social history, past surgical history and problem list.    Review of Systems   Constitutional: Negative for fever.   Respiratory: Negative.    Cardiovascular: Negative.    Gastrointestinal: Negative for abdominal pain and bowel incontinence.   Genitourinary: Negative for bladder incontinence and dysuria.   Musculoskeletal: Positive for back pain.   Skin: Negative.    Neurological: Negative for tingling, weakness, numbness and paresthesias.         Objective:      BP 128/89   Pulse 68   Temp 98.3 F (36.8 C) (Oral)   Resp 18   Ht 1.753 m (5\' 9" )   Wt 97.5 kg (215 lb)   BMI 31.75 kg/m     Physical Exam   Constitutional: He is oriented to person, place, and time. He appears well-developed and well-nourished. No distress.   Cardiovascular: Normal rate, regular rhythm and normal heart sounds.    Pulmonary/Chest: Effort  normal and breath sounds normal. No respiratory distress.   Abdominal: Soft. Bowel sounds are normal. He exhibits no distension. There is no tenderness.   Musculoskeletal: He exhibits tenderness. He exhibits no edema or deformity.   No midline spinal tenderness, crepitus, or step offs  5/5 strength BLE BUE, great toe extension   Neurological: He is alert and oriented to person, place, and time.   Skin: Skin is warm and dry. He is not diaphoretic.   Nursing note and vitals reviewed.        Assessment:       1. Acute bilateral low back pain without sciatica          Plan:       Flexeril 5 mg Q 8 hours PRN  Salon pas topically  Stretching/ core stabilization exercises  Ice/heat to back  If sx persist recheck w/ Dr Darrall Dears for possible PT referral    Procedures

## 2016-08-08 NOTE — Patient Instructions (Signed)
Exercises to Strengthen Your Lower Back  Strong lower back and abdominal muscles work together to support your spine. The exercises below will help strengthen the lower back. It is important that you begin exercising slowly and increase levels gradually.  Always begin any exercise program with stretching. If you feel pain while doing any of these exercises, stop and talk to your doctor about a more specific exercise program that better suits your condition.  Low back stretch  The point of stretching is to makeyou more flexible and increase your range of motion. Stretch only as much as you are able. Stretch slowly. Do not push your stretch to the limit. If at any point you feel pain while stretching, this is your (temporary) limit.   Lie on your back with your knees bent and both feet on the ground.   Slowly raise your left knee to your chest as you flatten your lower back against the floor. Hold for 5 seconds.   Relax and repeat the exercise with your right knee.   Do 10 of these exercises for each leg.   Repeat hugging both knees to your chest at the same time.  Building lower back strength  Start your exercise routine with 10 to 30 minutes a day, 1 to 3 times a day.  Initial exercises  Lying on your back:  1. Ankle pumps: Move your foot up and down, towards your head, and then away. Repeat 10 times with each foot.  2. Heel slides: Slowly bend your knee, drawing the heel of your foot towards you. Then slide your heel/footfrom you, straightening your knee. Do not lift your foot off the floor (this is not a leg lift).  3. Abdominal contraction: Bend your knees and put your hands on your stomach. Tighten your stomach muscles. Hold for 5 seconds, then relax. Repeat 10 times.  4. Straight leg raise: Bend one leg at the knee and keep the other leg straight. Tighten your stomach muscles. Slowly lift your straight leg 6 to 12 inches off the floor and hold for up to 5 seconds. Repeat 10 times on each  side.  Standing:  1. Wall squats: Stand with your back against the wall. Move your feet about 12 inches away from the wall. Tighten your stomach muscles, and slowly bend your knees until they are at about a 45 degree angle. Do not go down too far. Hold about 5 seconds. Then slowly return to your starting position. Repeat 10 times.  2. Heel raises: Stand facing the wall. Slowly raise the heels of your feet up and down, while keeping your toes on the floor. If you have trouble balancing, you can touch the wall with your hands. Repeat 10 times.  More advanced exercises  When you feel comfortable enough, try these exercises.  1. Kneeling lumbar extension: Begin on your hands and knees. At the same time, raise and straighten your right arm and left leg until they are parallel to the ground. Hold for 2 seconds and come back slowly to a starting position. Repeat with left arm and right leg, alternating 10 times.  2. Prone lumbar extension: Lie face down, arms extended overhead, palms on the floor. At the same time, raise your right arm and left leg as high as comfortably possible. Hold for 10 seconds and slowly return to start. Repeat with left arm and right leg, alternating 10 times. Gradually build up to 20 times. (Advanced: Repeat this exercise raising both arms and both legs a   few inches off the floor at the same time. Hold for 5 seconds and release.)  3. Pelvic tilt: Lie on the floor on your back with your knees bent at 90 degrees. Your feet should be flat on the floor. Inhale, exhale, then slowly contract your abdominal muscles bringing your navel toward your spine. Let your pelvis rock back until your lower back is flat on the floor. Hold for 10 seconds while breathing smoothly.  4. Abdominal crunch: Perform a pelvic tilt (above) flattening your lower back against the floor. Holding the tension in your abdominal muscles, take another breath and raise your shoulder blades off the ground (this is not a full sit-up).  Keep your head in line with your body (don't bend your neck forward). Hold for 2 seconds, then slowly lower.  Date Last Reviewed: 06/27/2014   2000-2016 [employer] [address] [place], [place] [address]. All rights reserved. This information is not intended as a substitute for professional medical care. Always follow your healthcare professional's instructions.        Back Pain (Acute or Chronic)    Back pain is one of the most common problems. The good news is that most people feel better in 1 to 2 weeks, and most of the rest in 1 to 2 months. Most people can remain active.  People experience and describe pain differently; not everyone is the same.   The pain can be sharp, stabbing, shooting, aching, cramping or burning.   Movement, standing, bending, lifting, sitting, or walking may worsen pain.   It can be localized to one spot or area, or it can be more generalized.   It can spread or radiate upwards, to the front, or go down your arms or legs (sciatica).   It can cause muscle spasm.  Most of the time, mechanical problems with the musclesor spine cause the pain. Mechanical problemsare usually caused by an injury to the muscles or ligaments. While illness can cause back pain, it is usually not caused by a serious illness. Mechanical problems include:   Physical activity such as sports, exercise, work, or normal activity   Overexertion, lifting, pushing, pulling incorrectly or too aggressively   Sudden twisting, bending, or stretching from an accident, or accidental movement   Poor posture   Stretching or moving wrong, without noticing pain at the time   Poor coordination, lack of regular exercise (check with your doctor about this)   Spinal disc disease or arthritis   Stress  Pain can also be related to pregnancy, or illness like appendicitis, bladder or kidney infections, pelvic infections, and many other things.  Acute back pain usually gets better in1 to 2 weeks. Back pain  related to disk disease, arthritis in the spinal joints or spinal stenosis (narrowing of the spinal canal) can become chronic and last for months or years.  Unless you had a physical injury (for example, a car accident or fall) X-rays are usually not needed for the initial evaluation of back pain. If pain continues and does not respond to medical treatment, X-rays and other tests may be needed.  Home care  Try these home care recommendations:   When in bed, tryto find a position of comfort. A firm mattress is best. Try lying flat on your back with pillows under your knees. You can also try lying on your side with your knees bent up towards your chest and a pillow between your knees.   At first, do not try to  stretch out the sore spots. If there is a strain, it is not like the good soreness you get after exercising without an injury. In this case, stretching may make it worse.   Avoid prolong sitting, long car rides, or travel. This puts more stress on the lower back than standing or walking.   During the first 24 to 72 hours after an acute injury or flare up of chronic back pain, apply an ice pack to the painful area for 20 minutes and then remove it for 20 minutes. Do this over a period of 60 to 90 minutes or several times a day. This will reduce swelling and pain. Wrap the ice pack in a thin towel or plastic to protect your skin.   You can start with ice, then switch to heat. Heat (hot shower, hot bath, or heating pad) reduces pain and works well for muscle spasms. Heat can be applied to the painful area for 20 minutes then remove it for 20 minutes. Do this over a period of 60 to 90 minutes or several times a day. Do not sleep on a heating pad. It can lead to skin burns or tissue damage.   You can alternate ice and heat therapy. Talk with your doctor aboutthe best treatment for your back pain.   Therapeutic massage can help relax the back muscles without stretching them.   Be aware of safe lifting  methods and do not lift anything without stretching first.  Medicines  Talk to your doctor before using medicine, especially if you have other medical problems or are taking other medicines.   You may use over-the-counter medicine as directed on the bottle to control pain, unless another pain medicine was prescribed. If you have chronic conditions like diabetes, liver or kidney disease, stomach ulcers, or gastrointestinal bleeding, or are taking blood thinners, talk to your doctor before taking any medicine.   Be careful if you are given a prescription medicines, narcotics, or medicine for muscle spasms. They can cause drowsiness, affect your coordination, reflexes, and judgement. Do not drive or operate heavy machinery.  Follow-up care  Follow up with your healthcare provider, or as advised.  A radiologist will review any X-rays that were taken. Your provide will notify you of any new findings that may affect your care.  Call 911  Call emergency services if any of the following occur:   Trouble breathing   Confusion   Very drowsy or trouble awakening   Fainting or loss of consciousness   Rapid or very slow heart rate   Loss of bowel or bladder control  When to seek medical advice  Call your healthcare provider right away if any of these occur:   Pain becomes worse or spreads to your legs   Weakness or numbness in one or both legs   Numbness in the groin or genital area  Date Last Reviewed: 07/27/2014   2000-2016 [employer] [address] [place], [place] [address]. All rights reserved. This information is not intended as a substitute for professional medical care. Always follow your healthcare professional's instructions.

## 2016-08-11 ENCOUNTER — Telehealth (INDEPENDENT_AMBULATORY_CARE_PROVIDER_SITE_OTHER): Payer: Self-pay

## 2016-08-11 NOTE — Telephone Encounter (Signed)
Message left to return call if needed

## 2017-01-01 ENCOUNTER — Inpatient Hospital Stay: Admission: RE | Admit: 2017-01-01 | Payer: Commercial Managed Care - POS | Source: Ambulatory Visit

## 2017-03-09 ENCOUNTER — Inpatient Hospital Stay: Admission: RE | Admit: 2017-03-09 | Payer: Commercial Managed Care - POS | Source: Ambulatory Visit

## 2017-03-09 DIAGNOSIS — G4733 Obstructive sleep apnea (adult) (pediatric): Secondary | ICD-10-CM

## 2017-03-09 NOTE — Procedures (Signed)
Date: 03/09/2017  HISTORY OF PRESENT ILLNESS: Bradley Hubbard comes in today for  followup of his sleep apnea.  Last seen back in 2017.  Breathing  is doing okay during the day.  He is a IT sales professional.  He says he  needs new CPAP supplies and had to be seen first.  He is using  the CPAP every night.  His is happy because he does not snore.  He wakes up feeling refreshed.  His major complaint is that the  head gear on his nasal pillow style mask is not adjustable.  He  prefers the nasal pillows to a face mask, but he just feels like  the head gear gets stretched out and he cannot tighten it out.  Otherwise, he is doing really well.  He thinks the CPAP helps.  There is no issues with the pressure.    ALLERGIES: To SULFA.    MEDICATIONS: Reviewed and listed in the chart.    PAST MEDICAL HISTORY: Notable for obstructive sleep apnea,  hypertension.    PHYSICAL EXAMINATION: VITAL SIGNS:  Pulse 77, respirations 16,  blood pressure 132/70, weight 216 pounds, satting 97% on room  air.  GENERAL:  Well-developed male, in no acute distress.  HEENT:  Pupils are equal, round, and reactive to light.  Extraocular movements are intact.  Oropharynx is clear.  NECK:  Supple.  Trachea is midline.  No thyromegaly or  lymphadenopathy.  LUNGS:  Clear.  No wheezes or crackles.  Talks in complete  sentences.  CARDIOVASCULAR:  Regular rate and rhythm.  No murmurs.  ABDOMEN:  Soft with good bowel sounds.  Nontender.  Nondistended.  EXTREMITIES:  No cyanosis or clubbing of the digits.  2+ pulses.  He moves all extremities.  Gait and station is stable.  Strength  is 5/5 in bilateral upper and lower extremities.  SKIN:  No rashes or easy bruising.  NEURO:  He is awake and alert.  Cranial nerves 2 through 12  grossly intact.  Sensation is intact to light touch and pain.    LABORATORY DATA: RDI is 31.  Normalizing with a CPAP of 10.    ASSESSMENT: A 52 year old male with severe obstructive sleep  apnea.    PLAN:  1.  OSA, severe in nature.  It should  be treated.  Doing      well with the CPAP.  We did discuss other types of nasal      pillows mask.  He will talk to his DME to see what other      options there are.  Nightly use of CPAP is recommended.  He      verbalized understanding, and he does feel like it helps.  2.  Hypertension--blood pressure at goal.  On lisinopril,      spironolactone, diltiazem.    He will follow up in one year.        [id number]  DD: 03/09/2017 15:12:26  DT: 03/09/2017 15:55:28  JOB: [id number]

## 2017-03-15 ENCOUNTER — Telehealth: Payer: Self-pay | Admitting: Internal Medicine

## 2017-06-16 ENCOUNTER — Ambulatory Visit (INDEPENDENT_AMBULATORY_CARE_PROVIDER_SITE_OTHER): Payer: Commercial Managed Care - POS | Attending: Internal Medicine

## 2019-07-13 IMAGING — CT NM Myocard Perfusion Spect (Stress)
1 series · 14 of 16 positions shown, 18 images · non-contrast
Comparison: None.

EXAMINATION: MYOCARDIAL IMAGING (REST/PHARMACOLOGIC-STRESS/SPECT/WITH GATED IMAGING AND   
 EJECTION FRACTION MEASUREMENT)
CLINICAL HISTORY: MZIKO (dyspnea on exertion) MZIKO (dyspnea on exertion). HTN,              
 PALPITATIONS, EPILEPSY.                                                                   
 BMI: 38
TECHNIQUE: A TWO day study was performed.                                                
 Stress Test:  According to the Cardiologist's report the patient was given a rapid        
 injection (approximately 10 sec) of Regadenoson 0.4 mg/5ml IV, followed immediately by    
 5ml saline flush. The patient's baseline heart rate was 61 BPM with a maximum heart rate  
 of 89 BPM. The baseline blood pressure was 161/95 mmHg and maximum blood pressure was     
 168/93 mmHg. The blood pressure response to Regadenoson was appropriate. The resting ECG  
 showed normal sinus rhythm. The ECG during stress showed no significant ST-segment        
 changes, no evidence of ischemia.  The patient experienced no chest pain symptoms during  
 the bolus injection.                                                                      
 Nuclear Imaging: The patient was given 32.4 mCi of 9c66m Myoview IV at rest and           
 approximately 30 minutes after injection resting cardiac SPECT imaging was performed. For 
 stress imaging, ten to twenty seconds after the Regadenoson infusion and saline flush the 
 patient received 33 mCi of 9c66m Myoview IV and approximately 30-45 minutes post          
 injection cardiac gated SPECT imaging was performed.  Nondiagnostic CT images were        
 obtained for attenuation correction purposes.

[Series 3: ac bh stress cardiac 5.0 b31s · axial · 0.98mm/px · z∈[+1512,+1692]mm · 14 of 42 slices shown, 18 images]
[im 3/42  soft-tissue]
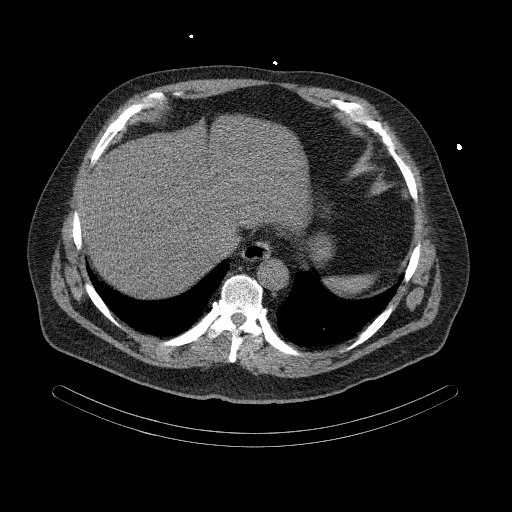
[im 3/42  bone]
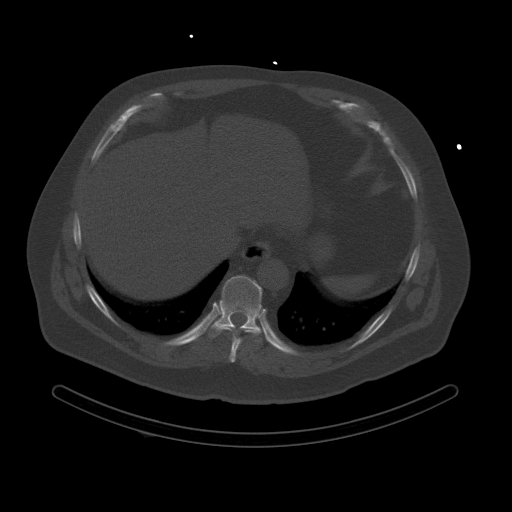
[im 6/42  soft-tissue]
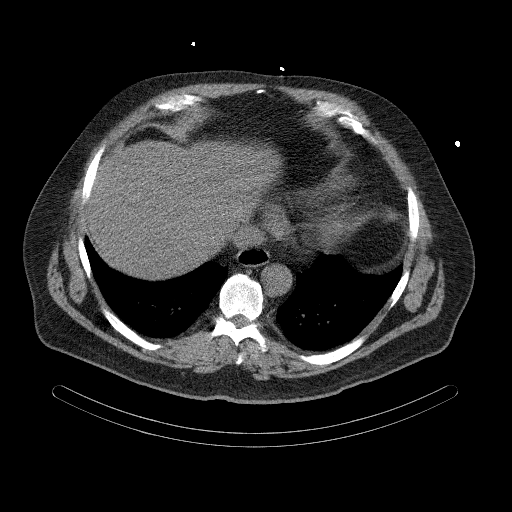
[im 9/42  soft-tissue]
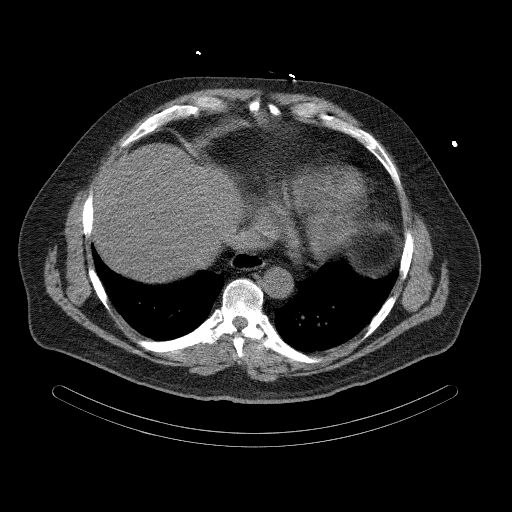
[im 11/42  soft-tissue]
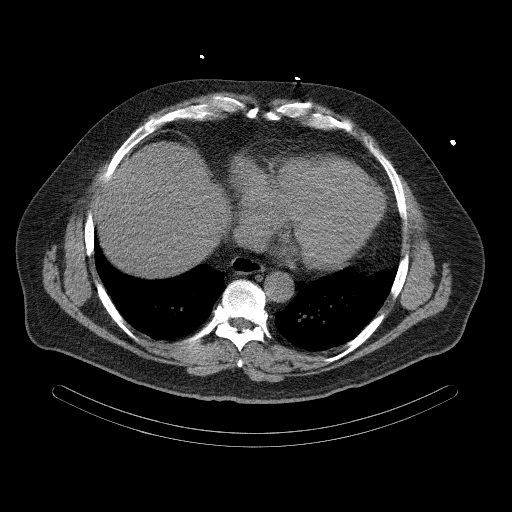
[im 14/42  soft-tissue]
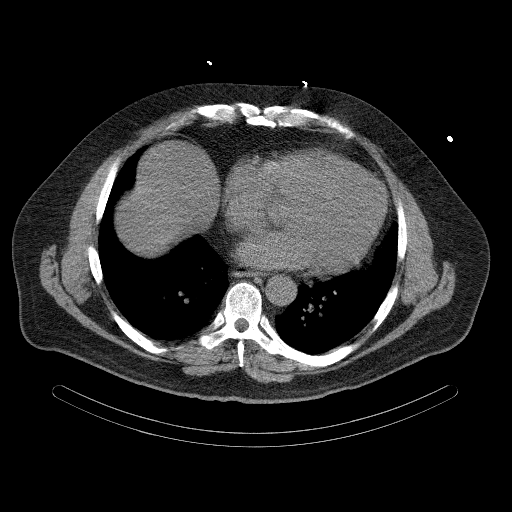
[im 14/42  bone]
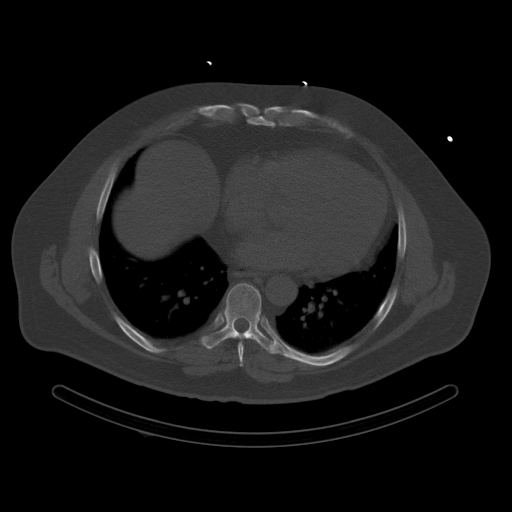
[im 17/42  soft-tissue]
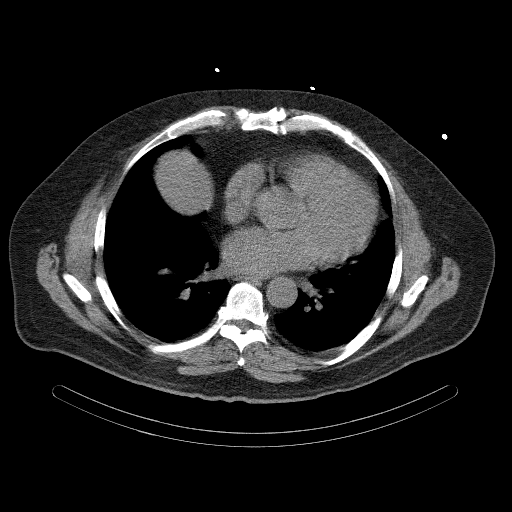
[im 20/42  soft-tissue]
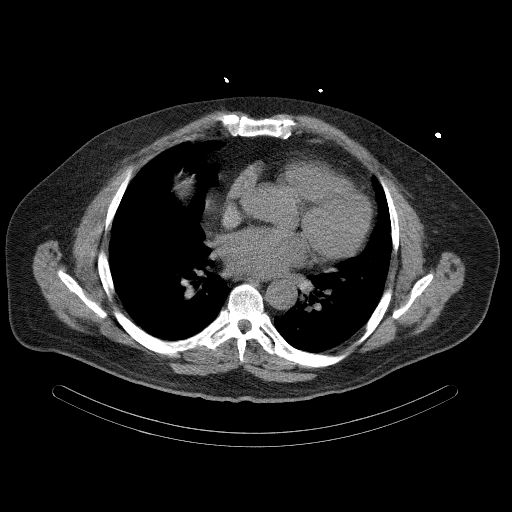
[im 22/42  soft-tissue]
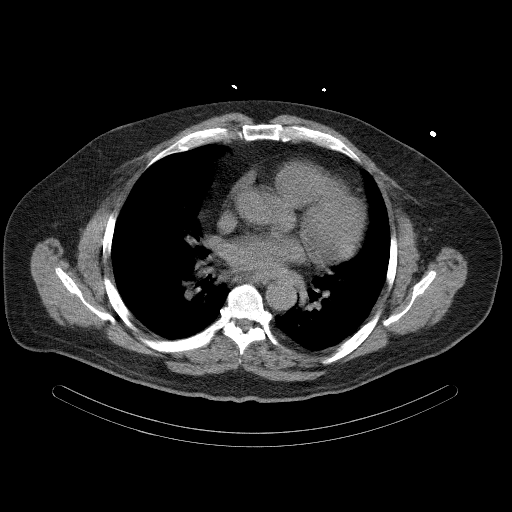
[im 25/42  soft-tissue]
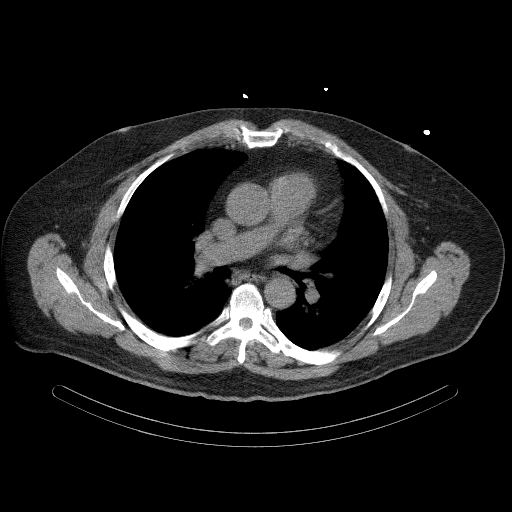
[im 25/42  bone]
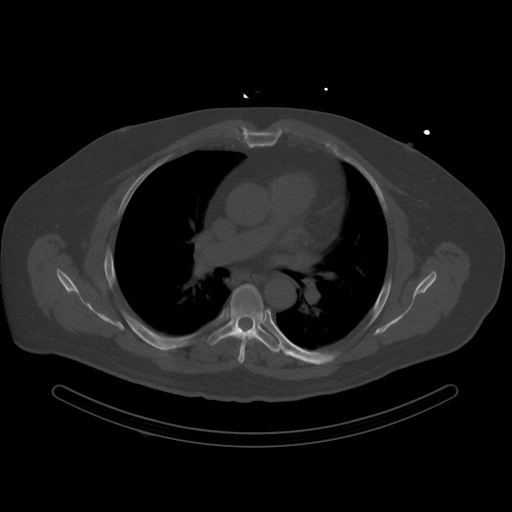
[im 28/42  soft-tissue]
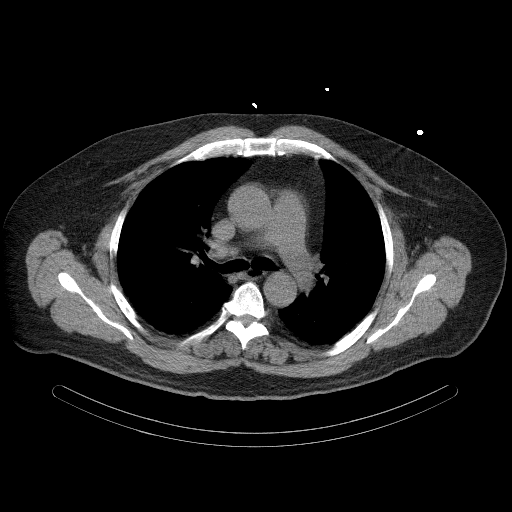
[im 31/42  soft-tissue]
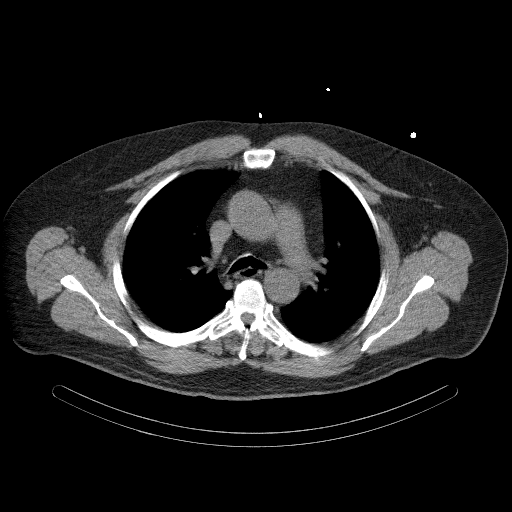
[im 33/42  soft-tissue]
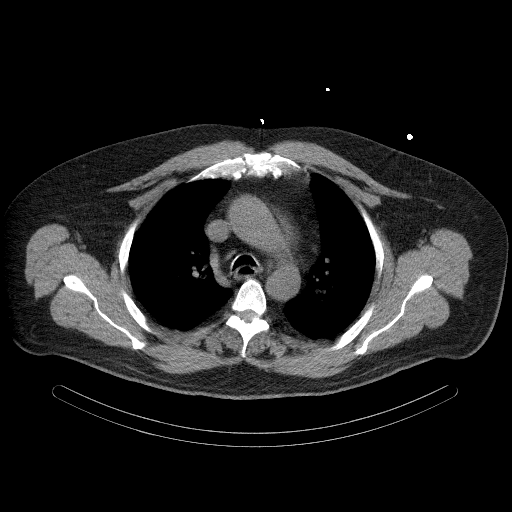
[im 36/42  soft-tissue]
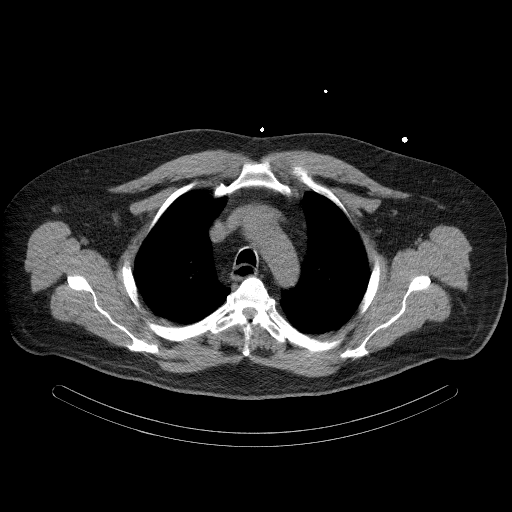
[im 36/42  bone]
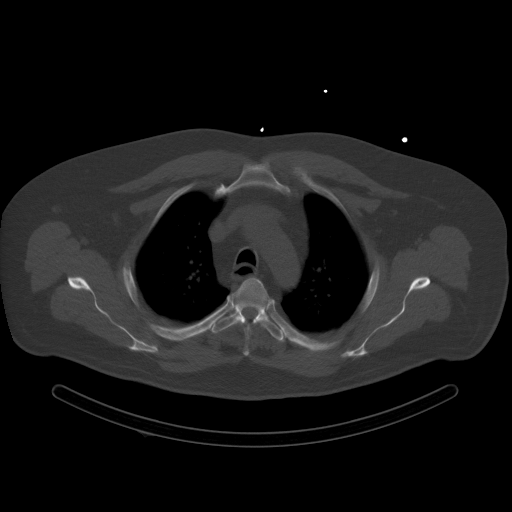
[im 39/42  soft-tissue]
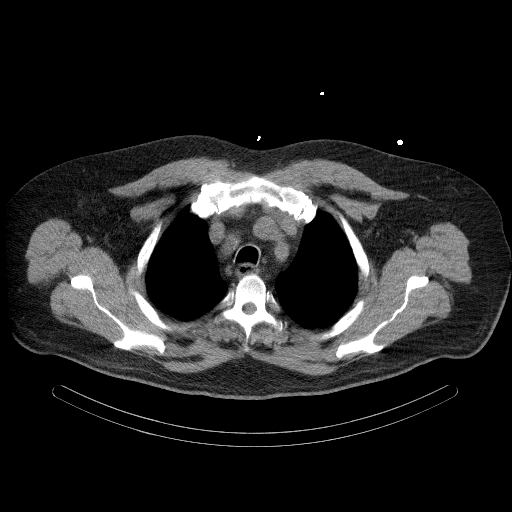

[14 of 16 positions shown; findings below may reference images not displayed]

FINDINGS: No definite scintigraphic findings identified to suggest reversible myocardial perfusion  
 defect.                                                                                   
 Gated post-stress images demonstrate normal left ventricular wall thickening and motion.  
 The left ventricular volume is normal and the left ventricular ejection fraction is 52%.  
 End diastolic volume: 169 ml.                                                             
 End systolic volume: 82 ml.                                                               
 TID:
 Overall study quality is good.                                                            
 On low-dose nondiagnostic CT images through chest, cardiac size mildly enlarged. Mild     
 calcific coronary artery atherosclerosis left anterior descending and possibly in left    
 circumflex arteries. Respiratory motion artifact and/or atelectasis in the partially      
 visualized lungs.
IMPRESSION: 1. No definite scintigraphic findings identified to suggest reversible myocardial         
 ischemia.                                                                                 
 2. Normal left ventricular systolic function.

## 2019-11-28 ENCOUNTER — Encounter (INDEPENDENT_AMBULATORY_CARE_PROVIDER_SITE_OTHER): Payer: Self-pay | Admitting: Physician Assistant

## 2019-11-28 ENCOUNTER — Ambulatory Visit (INDEPENDENT_AMBULATORY_CARE_PROVIDER_SITE_OTHER): Payer: Commercial Managed Care - POS | Admitting: Physician Assistant

## 2019-11-28 VITALS — BP 127/86 | HR 84 | Temp 97.0°F | Resp 16 | Ht 68.0 in | Wt 219.0 lb

## 2019-11-28 DIAGNOSIS — M545 Low back pain, unspecified: Secondary | ICD-10-CM

## 2019-11-28 MED ORDER — TIZANIDINE HCL 2 MG PO CAPS
2.0000 mg | ORAL_CAPSULE | Freq: Three times a day (TID) | ORAL | 0 refills | Status: DC | PRN
Start: 2019-11-28 — End: 2020-06-27

## 2019-11-28 MED ORDER — DICLOFENAC SODIUM 75 MG PO TBEC
75.0000 mg | DELAYED_RELEASE_TABLET | Freq: Two times a day (BID) | ORAL | 0 refills | Status: DC
Start: 2019-11-28 — End: 2020-06-27

## 2019-11-28 NOTE — Progress Notes (Signed)
Subjective:    Patient ID:   HPI  Bradley Hubbard is a 54 y.o. male who complains of low back pain.  Patient states that 4 days ago he was at the gym and when he bent over when he had a tweak in his back.  He states that 6 hours later he felt as though the back pain had gotten worse and he says it is now an 8 out of 10 pain that has been constant over the last 3 days.  Patient says that the pain does not radiate and it is sharp.  He says that he has tried taking Flexeril that he had at home which did not help he has also taken ibuprofen 600 mg that did not help.  Patient states that he went to a chiropractor yesterday who assessed him and said that he believes his pain to be a muscle strain.  Patient denies numbness or tingling.    The following portions of the patient's history were reviewed and updated as appropriate: allergies, current medications, past medical history, past surgical history and problem list.    Review of Systems   Constitutional: Negative for fever.   HENT: Negative for congestion and sore throat.    Eyes: Negative for redness.   Respiratory: Negative for cough and wheezing.    Cardiovascular: Negative for chest pain.   Gastrointestinal: Negative for abdominal pain.   Musculoskeletal: Positive for back pain. Negative for arthralgias.        Patient reports a sharp 8 out of 10 back pain that he has had for the last 4 days.  He describes the pain to be over his SI joint.   Skin: Negative for rash.   Neurological: Negative for dizziness and headaches.   Hematological: Does not bruise/bleed easily.   Psychiatric/Behavioral: Negative for behavioral problems.       Objective:     Vitals:    11/28/19 1106   BP: 127/86   Pulse: 84   Resp: 16   Temp: 97 F (36.1 C)   SpO2: 97%       Physical Exam  Vitals and nursing note reviewed.   Constitutional:       Appearance: Normal appearance.   HENT:      Head: Normocephalic and atraumatic.      Nose: Nose normal.      Mouth/Throat:      Mouth: Mucous  membranes are moist.      Pharynx: Oropharynx is clear.   Eyes:      Extraocular Movements: Extraocular movements intact.      Conjunctiva/sclera: Conjunctivae normal.      Pupils: Pupils are equal, round, and reactive to light.   Cardiovascular:      Rate and Rhythm: Normal rate and regular rhythm.      Pulses: Normal pulses.      Heart sounds: Normal heart sounds.   Pulmonary:      Effort: Pulmonary effort is normal.      Breath sounds: Normal breath sounds.   Musculoskeletal:         General: Deformity present. No swelling, tenderness or signs of injury.      Comments: Patient has limited flexion-extension at the waist or due to low back pain.  Patient has pain radiating down the back of his legs to just behind his knee during straight leg raise.   Skin:     General: Skin is warm and dry.   Neurological:      Mental  Status: He is alert.   Psychiatric:         Mood and Affect: Mood normal.         Behavior: Behavior normal.           Assessment and Plan:   Billie was seen today for back pain.    Diagnoses and all orders for this visit:    Low back pain without sciatica, unspecified back pain laterality, unspecified chronicity    Other orders  -     TiZANidine (Zanaflex) 2 MG capsule; Take 1 capsule (2 mg total) by mouth 3 (three) times daily as needed for Muscle spasms Please take 1-2 tabs  -     diclofenac (VOLTAREN) 75 MG EC tablet; Take 1 tablet (75 mg total) by mouth 2 (two) times daily    Patient was given prescription for Zanaflex as well as Voltaren due to the lack of response to therapy for ibuprofen and Flexeril.  Patient was advised that he can take 2 capsules of the Zanaflex if he does not have response to 1 capsule (2 mg).  Patient was advised to continue using heat on his back which might help loosen the muscles.  He was advised to rest and not lift anything.  We discussed the importance of lifting from the knees in the future.    The patient was advised to seek medical attention for new or worsening  symptoms.    The patient is in agreement with this treatment and plan.          Orlene Och, PA  [facility]  11/28/2019 11:27 AM

## 2020-06-27 ENCOUNTER — Ambulatory Visit (INDEPENDENT_AMBULATORY_CARE_PROVIDER_SITE_OTHER)
Admission: RE | Admit: 2020-06-27 | Discharge: 2020-06-27 | Disposition: A | Discharge status: Home / Self Care | Payer: Commercial Managed Care - POS | Source: Ambulatory Visit | Attending: Family | Admitting: Family

## 2020-06-27 ENCOUNTER — Ambulatory Visit (INDEPENDENT_AMBULATORY_CARE_PROVIDER_SITE_OTHER): Payer: Commercial Managed Care - POS | Admitting: Family

## 2020-06-27 ENCOUNTER — Encounter (INDEPENDENT_AMBULATORY_CARE_PROVIDER_SITE_OTHER): Payer: Self-pay

## 2020-06-27 VITALS — BP 133/80 | HR 67 | Temp 97.9°F | Resp 18 | Ht 69.0 in | Wt 215.0 lb

## 2020-06-27 DIAGNOSIS — M79645 Pain in left finger(s): Secondary | ICD-10-CM

## 2020-06-27 DIAGNOSIS — M79644 Pain in right finger(s): Secondary | ICD-10-CM

## 2020-06-27 DIAGNOSIS — M19041 Primary osteoarthritis, right hand: Secondary | ICD-10-CM

## 2020-06-27 DIAGNOSIS — M19042 Primary osteoarthritis, left hand: Secondary | ICD-10-CM

## 2020-06-27 NOTE — Progress Notes (Signed)
Subjective:    Patient ID: Bradley Hubbard is a 55 y.o. male.    This provider utilized all appropriate PPE equipment during this visit Gloves, loop mask, patient wearing face mask.    All provider tools were cleaned with disinfectant solution before and after use on this patient.    HPI Patient report bilateral thumb pain. Patient reports pain as been present for about 1.5 years. Patient denies injury. Patient reports taking diclofenac in the recent past for lower back pain and the thumb pain resolved but he is taking BP medications. Patient took some ibuprofen for pain and it helped some.     The following portions of the patient's history were reviewed and updated as appropriate: allergies, current medications, past family history, past medical history, past social history, past surgical history and problem list.    Review of Systems   Constitutional: Negative for chills and fever.   HENT: Negative for congestion.    Respiratory: Negative for cough.    Musculoskeletal: Positive for joint swelling and myalgias. Negative for back pain.        Swelling of thumbs bilaterally   Skin: Negative for rash.   Psychiatric/Behavioral: Negative for sleep disturbance.         Objective:    BP 133/80    Pulse 67    Temp 97.9 F (36.6 C) (Tympanic)    Resp 18    Ht 1.753 m (5\' 9" )    Wt 97.5 kg (215 lb)    BMI 31.75 kg/m     Physical Exam  Vitals reviewed.   Constitutional:       Appearance: He is not ill-appearing.   HENT:      Head: Atraumatic.   Cardiovascular:      Rate and Rhythm: Normal rate.      Pulses: Normal pulses.   Musculoskeletal:         General: Tenderness present.      Comments: Tenderness and swelling at base of thumbs bilaterally; pain with flexion of the thumbs. No redness   Skin:     General: Skin is warm.   Neurological:      Mental Status: He is alert and oriented to person, place, and time.   Psychiatric:         Mood and Affect: Mood normal.           Assessment and Plan:       Bradley Hubbard was seen  today for thumb pain.    Diagnoses and all orders for this visit:    Bilateral thumb pain  -     XR Finger(s) Right Minimum 2 View; Future  -     XR Finger(s) Left Minimum 2 View; Future    Osteoarthritis of both hands, unspecified osteoarthritis type    XR Finger(s) Left Minimum 2 View    Result Date: 06/27/2020  No acute fracture or dislocation. Mild to moderate DJD basal joint with joint space narrowing, osteophytes, and sclerosis. Unremarkable soft tissues. No radiopaque foreign body. ReadingStation:[email or web address]    XR Finger(s) Right Minimum 2 View    Result Date: 06/27/2020  No acute fracture dislocation. Mild DJD basal joint with mild joint space narrowing. Tiny osteophytes. Unremarkable soft tissues. No radiopaque foreign body. 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 Xray reveals mild DJD of both thumbs; reviewed with patien. Recommend to take acetaminophen, rest, heating/ice pack. Patient has follow up with Ortho this week. May need to consider steroid injections and further  imaging if no better. The patient was counseled on possible medication/treatment side effects. The patient was encouraged to take any and all medications as prescribed, read all pharmacy handouts, and patient instructions.     Vital signs noted no acute management indicated at this time. The patient was instructed to follow up with their primary care provider. The patient was also instructed to seek medical advice if they experience worsening symptoms and to follow up in 2-3 days if no improvement in symptoms.     The patient was instructed to keep all current healthcare appointments. At the conclusion of the visit we reviewed diagnosis, treatment plan, diagnostic tests,  and follow up instructions pertaining to this visit. All patient questions and concerns regarding the current condition were addressed.        Gustavus Messing, FNP  [facility]  06/27/2020  7:19 PM
